# Patient Record
Sex: Female | Born: 1985 | Race: Black or African American | Hispanic: No | Marital: Single | Smoking: Former smoker
Health system: Southern US, Community
[De-identification: ages and names within clinical notes are randomized; demographics above are authoritative.]

## PROBLEM LIST (undated history)

## (undated) DIAGNOSIS — N83209 Unspecified ovarian cyst, unspecified side: Secondary | ICD-10-CM

## (undated) HISTORY — PX: FOOT SURGERY: SHX648

---

## 2012-12-16 ENCOUNTER — Encounter (HOSPITAL_BASED_OUTPATIENT_CLINIC_OR_DEPARTMENT_OTHER): Payer: Self-pay | Admitting: *Deleted

## 2012-12-16 ENCOUNTER — Emergency Department (HOSPITAL_BASED_OUTPATIENT_CLINIC_OR_DEPARTMENT_OTHER)
Admission: EM | Admit: 2012-12-16 | Discharge: 2012-12-16 | Disposition: A | Discharge status: Home / Self Care | Payer: Self-pay | Attending: Emergency Medicine | Admitting: Emergency Medicine

## 2012-12-16 DIAGNOSIS — Z3202 Encounter for pregnancy test, result negative: Secondary | ICD-10-CM | POA: Insufficient documentation

## 2012-12-16 DIAGNOSIS — M545 Low back pain, unspecified: Secondary | ICD-10-CM | POA: Insufficient documentation

## 2012-12-16 DIAGNOSIS — N390 Urinary tract infection, site not specified: Secondary | ICD-10-CM | POA: Insufficient documentation

## 2012-12-16 DIAGNOSIS — R300 Dysuria: Secondary | ICD-10-CM | POA: Insufficient documentation

## 2012-12-16 LAB — URINALYSIS, ROUTINE W REFLEX MICROSCOPIC
Bilirubin Urine: NEGATIVE
Nitrite: POSITIVE — AB
Specific Gravity, Urine: 1.021 (ref 1.005–1.030)
Urobilinogen, UA: 1 mg/dL (ref 0.0–1.0)

## 2012-12-16 LAB — URINE MICROSCOPIC-ADD ON

## 2012-12-16 MED ORDER — CYCLOBENZAPRINE HCL 10 MG PO TABS: 10.0000 mg | ORAL_TABLET | Freq: Three times a day (TID) | ORAL | Status: DC | PRN

## 2012-12-16 MED ORDER — SULFAMETHOXAZOLE-TRIMETHOPRIM 800-160 MG PO TABS: 1.0000 | ORAL_TABLET | Freq: Two times a day (BID) | ORAL | Status: DC

## 2012-12-16 MED ORDER — IBUPROFEN 800 MG PO TABS: 800.0000 mg | ORAL_TABLET | Freq: Three times a day (TID) | ORAL | Status: DC | PRN

## 2012-12-16 NOTE — ED Notes (Signed)
Lower back pain x 2 days. Sharp in nature. No known injury.

## 2012-12-16 NOTE — ED Provider Notes (Signed)
History     CSN: [id number]  Arrival date & time 12/16/12  1827   First MD Initiated Contact with Patient 12/16/12 1920      Chief Complaint  Patient presents with  . Back Pain    (Consider location/radiation/quality/duration/timing/severity/associated sxs/prior treatment) Patient is a 27 y.o. female presenting with back pain.  Back Pain  Pt reports moderate aching R lower back pain since yesterday worse with movement. She reports some mild dysuria, no fever or vomiting. No numbness or tingling in legs. No abdominal pain.   History reviewed. No pertinent past medical history.  History reviewed. No pertinent past surgical history.  No family history on file.  History  Substance Use Topics  . Smoking status: Never Smoker   . Smokeless tobacco: Not on file  . Alcohol Use: No    OB History   Grav Para Term Preterm Abortions TAB SAB Ect Mult Living                  Review of Systems  Musculoskeletal: Positive for back pain.   All other systems reviewed and are negative except as noted in HPI.   Allergies  Review of patient's allergies indicates no known allergies.  Home Medications  No current outpatient prescriptions on file.  BP 118/54  Pulse 60  Temp(Src) 98.6 F (37 C) (Oral)  Resp 16  Wt 130 lb (58.968 kg)  SpO2 100%  Physical Exam  Nursing note and vitals reviewed. Constitutional: She is oriented to person, place, and time. She appears well-developed and well-nourished.  HENT:  Head: Normocephalic and atraumatic.  Eyes: EOM are normal. Pupils are equal, round, and reactive to light.  Neck: Normal range of motion. Neck supple.  Cardiovascular: Normal rate, normal heart sounds and intact distal pulses.   Pulmonary/Chest: Effort normal and breath sounds normal.  Abdominal: Bowel sounds are normal. She exhibits no distension. There is no tenderness.  Musculoskeletal: Normal range of motion. She exhibits tenderness (Mild tenderness R lumbar paraspinal  muscles, no CVA tenderness). She exhibits no edema.  Neurological: She is alert and oriented to person, place, and time. She has normal strength. No cranial nerve deficit or sensory deficit.  Skin: Skin is warm and dry. No rash noted.  Psychiatric: She has a normal mood and affect.    ED Course  Procedures (including critical care time)  Labs Reviewed  URINALYSIS, ROUTINE W REFLEX MICROSCOPIC - Abnormal; Notable for the following:    APPearance CLOUDY (*)    Nitrite POSITIVE (*)    Leukocytes, UA SMALL (*)    All other components within normal limits  URINE MICROSCOPIC-ADD ON - Abnormal; Notable for the following:    Bacteria, UA MANY (*)    All other components within normal limits  URINE CULTURE  PREGNANCY, URINE   No results found.   1. Low back pain   2. UTI (urinary tract infection)       MDM  UA shows likely UTI given symptoms. Back pain is musculoskeletal, no concern for pyelonephritis. Bactrim for UTI, Motrin/Flexeril for back pain.         Charles B. Bernette Mayers, MD 12/16/12 1930

## 2012-12-16 NOTE — ED Notes (Signed)
Pt reports low back pain x 2 days without injury.  Denies numbness, tingling in extremities ot loss of bowel or bladder.  MAE WNL.

## 2012-12-19 LAB — URINE CULTURE: Colony Count: >=100000

## 2012-12-20 ENCOUNTER — Telehealth (HOSPITAL_COMMUNITY): Payer: Self-pay | Admitting: Emergency Medicine

## 2012-12-20 NOTE — ED Notes (Signed)
+  Urine. Patient treated with Bactrim. Sensitive to same. Per protocol MD.

## 2012-12-20 NOTE — ED Notes (Signed)
Patient has +Urine culture. °

## 2013-06-30 ENCOUNTER — Emergency Department (HOSPITAL_BASED_OUTPATIENT_CLINIC_OR_DEPARTMENT_OTHER)
Admission: EM | Admit: 2013-06-30 | Discharge: 2013-06-30 | Disposition: A | Discharge status: Home / Self Care | Payer: Medicaid Other | Attending: Emergency Medicine | Admitting: Emergency Medicine

## 2013-06-30 ENCOUNTER — Encounter (HOSPITAL_BASED_OUTPATIENT_CLINIC_OR_DEPARTMENT_OTHER): Payer: Self-pay | Admitting: Emergency Medicine

## 2013-06-30 ENCOUNTER — Emergency Department (HOSPITAL_BASED_OUTPATIENT_CLINIC_OR_DEPARTMENT_OTHER): Payer: Medicaid Other

## 2013-06-30 DIAGNOSIS — Z792 Long term (current) use of antibiotics: Secondary | ICD-10-CM | POA: Insufficient documentation

## 2013-06-30 DIAGNOSIS — O200 Threatened abortion: Secondary | ICD-10-CM

## 2013-06-30 DIAGNOSIS — Z79899 Other long term (current) drug therapy: Secondary | ICD-10-CM | POA: Insufficient documentation

## 2013-06-30 DIAGNOSIS — R110 Nausea: Secondary | ICD-10-CM | POA: Insufficient documentation

## 2013-06-30 DIAGNOSIS — R1031 Right lower quadrant pain: Secondary | ICD-10-CM | POA: Insufficient documentation

## 2013-06-30 DIAGNOSIS — R1033 Periumbilical pain: Secondary | ICD-10-CM | POA: Insufficient documentation

## 2013-06-30 LAB — CBC WITH DIFFERENTIAL/PLATELET
Basophils Relative: 0 % (ref 0–1)
Eosinophils Relative: 2 % (ref 0–5)
HCT: 34.4 % — ABNORMAL LOW (ref 36.0–46.0)
Hemoglobin: 11.4 g/dL — ABNORMAL LOW (ref 12.0–15.0)
Lymphocytes Relative: 33 % (ref 12–46)
Lymphs Abs: 1.6 10*3/uL (ref 0.7–4.0)
MCV: 70.3 fL — ABNORMAL LOW (ref 78.0–100.0)
Monocytes Relative: 11 % (ref 3–12)
Neutro Abs: 2.7 10*3/uL (ref 1.7–7.7)
RBC: 4.89 MIL/uL (ref 3.87–5.11)
WBC: 4.9 10*3/uL (ref 4.0–10.5)

## 2013-06-30 LAB — URINALYSIS, ROUTINE W REFLEX MICROSCOPIC
Bilirubin Urine: NEGATIVE
Hgb urine dipstick: NEGATIVE
Ketones, ur: 15 mg/dL — AB
Protein, ur: 30 mg/dL — AB
Urobilinogen, UA: 1 mg/dL (ref 0.0–1.0)

## 2013-06-30 LAB — PREGNANCY, URINE: Preg Test, Ur: POSITIVE — AB

## 2013-06-30 MED ORDER — METOCLOPRAMIDE HCL 10 MG PO TABS: 10.0000 mg | ORAL_TABLET | Freq: Four times a day (QID) | ORAL | Status: AC

## 2013-06-30 NOTE — ED Provider Notes (Signed)
CSN: [id number]     Arrival date & time 06/30/13  2050 History   First MD Initiated Contact with Patient 06/30/13 2107     This chart was scribed for Celene Kras, MD by Ladona Ridgel Day, ED scribe. This patient was seen in room MH10/MH10 and the patient's care was started at 2050.  Chief Complaint  Patient presents with  . Abdominal Pain   The history is provided by the patient. No language interpreter was used.   HPI Comments: Alexandra Sparks is a 27 y.o. female who presents to the Emergency Department complaining of intermittent, cramping periumbilical abdominal pain, 6/10 in pain, since yesterday. She reports associated nausea for x2 days. She states nothing makes her pain worse or better. She denies any vaginal bleeding or d/c. She denies dysuria, emesis, diarrhea, cough, fever/chills.  History reviewed. No pertinent past medical history. History reviewed. No pertinent past surgical history. No family history on file. History  Substance Use Topics  . Smoking status: Never Smoker   . Smokeless tobacco: Not on file  . Alcohol Use: No   OB History   Grav Para Term Preterm Abortions TAB SAB Ect Mult Living                 Review of Systems  Constitutional: Negative for fever and chills.  Respiratory: Negative for shortness of breath.   Gastrointestinal: Positive for nausea and abdominal pain. Negative for vomiting.  Musculoskeletal: Negative for back pain.  Neurological: Negative for weakness.  All other systems reviewed and are negative.   A complete 10 system review of systems was obtained and all systems are negative except as noted in the HPI and PMH.   Allergies  Review of patient's allergies indicates no known allergies.  Home Medications   Current Outpatient Rx  Name  Route  Sig  Dispense  Refill  . cyclobenzaprine (FLEXERIL) 10 MG tablet   Oral   Take 1 tablet (10 mg total) by mouth 3 (three) times daily as needed for muscle spasms.   30 tablet   0   . ibuprofen  (ADVIL,MOTRIN) 800 MG tablet   Oral   Take 1 tablet (800 mg total) by mouth every 8 (eight) hours as needed for pain.   30 tablet   0   . metoCLOPramide (REGLAN) 10 MG tablet   Oral   Take 1 tablet (10 mg total) by mouth every 6 (six) hours.   30 tablet   0   . sulfamethoxazole-trimethoprim (BACTRIM DS,SEPTRA DS) 800-160 MG per tablet   Oral   Take 1 tablet by mouth 2 (two) times daily.   6 tablet   0    Triage Vitals: BP 122/60  Pulse 81  Temp(Src) 98.8 F (37.1 C) (Oral)  Resp 18  Wt 130 lb (58.968 kg)  SpO2 100%  LMP 05/23/2013 Physical Exam  Nursing note and vitals reviewed. Constitutional: She appears well-developed and well-nourished. No distress.  HENT:  Head: Normocephalic and atraumatic.  Right Ear: External ear normal.  Left Ear: External ear normal.  Eyes: Conjunctivae are normal. Right eye exhibits no discharge. Left eye exhibits no discharge. No scleral icterus.  Neck: Neck supple. No tracheal deviation present.  Cardiovascular: Normal rate, regular rhythm and intact distal pulses.   Pulmonary/Chest: Effort normal and breath sounds normal. No stridor. No respiratory distress. She has no wheezes. She has no rales.  Abdominal: Soft. Bowel sounds are normal. She exhibits no distension. There is tenderness (tender to palpation RLQ). There  is no rebound and no guarding.  Musculoskeletal: She exhibits no edema and no tenderness.  Neurological: She is alert. She has normal strength. No sensory deficit. Cranial nerve deficit:  no gross defecits noted. She exhibits normal muscle tone. She displays no seizure activity. Coordination normal.  Skin: Skin is warm and dry. No rash noted.  Psychiatric: She has a normal mood and affect.    ED Course  Procedures (including critical care time) DIAGNOSTIC STUDIES: Oxygen Saturation is 100% on room air, normal by my interpretation.    COORDINATION OF CARE: At 915 PM Discussed treatment plan with patient which includes  abdominal ultrasound, blood work, pelvic exam. Patient agrees.   Labs Review Imaging Review US Ob Comp Less 14 Wks  06/30/2013   CLINICAL DATA:  Abdominal pain. Pregnancy.  EXAM: OBSTETRIC <14 WK [place] AND TRANSVAGINAL OB US  TECHNIQUE: Both transabdominal and transvaginal ultrasound examinations were performed for complete evaluation of the gestation as well as the maternal uterus, adnexal regions, and pelvic cul-de-sac. Transvaginal technique was performed to assess early pregnancy.  COMPARISON:  None.  FINDINGS: Intrauterine gestational sac: Visualized/normal in shape.  Yolk sac:  Negative  Embryo:  Negative  Cardiac Activity: Negative  Heart Rate:  Negative bpm  MSD:  4.5 mm  mm   5 w   2  d  Maternal uterus/adnexae: Negative  IMPRESSION: Intrauterine gestational sac. Mean sac diameter 4.5 mm corresponding to 5 week 2 day. Yolk sac and fetus not identified. Close followup suggested.   Electronically Signed   By: Marlan Palau M.D.   On: 06/30/2013 22:36   US Ob Transvaginal  06/30/2013   CLINICAL DATA:  Abdominal pain. Pregnancy.  EXAM: OBSTETRIC <14 WK [place] AND TRANSVAGINAL OB US  TECHNIQUE: Both transabdominal and transvaginal ultrasound examinations were performed for complete evaluation of the gestation as well as the maternal uterus, adnexal regions, and pelvic cul-de-sac. Transvaginal technique was performed to assess early pregnancy.  COMPARISON:  None.  FINDINGS: Intrauterine gestational sac: Visualized/normal in shape.  Yolk sac:  Negative  Embryo:  Negative  Cardiac Activity: Negative  Heart Rate:  Negative bpm  MSD:  4.5 mm  mm   5 w   2  d  Maternal uterus/adnexae: Negative  IMPRESSION: Intrauterine gestational sac. Mean sac diameter 4.5 mm corresponding to 5 week 2 day. Yolk sac and fetus not identified. Close followup suggested.   Electronically Signed   By: Marlan Palau M.D.   On: 06/30/2013 22:36     MDM   1. Threatened miscarriage in early pregnancy    the patient has a threatened  miscarriage.  The ultrasound shows a gestational sac but no definitive fetal pole. This however does correlate with her Quant and history. She will require close follow up with an OB/GYN doctor for serial hCGs and reevaluation I personally performed the services described in this documentation, which was scribed in my presence.  The recorded information has been reviewed and is accurate.     Celene Kras, MD 06/30/13 2258

## 2013-06-30 NOTE — ED Notes (Signed)
Pt notified by EDP Lynelle Doctor of +preg test

## 2013-06-30 NOTE — ED Notes (Signed)
MD at bedside. 

## 2013-06-30 NOTE — ED Notes (Signed)
Abdominal pain since last night. Nausea x 2 days.

## 2013-07-06 ENCOUNTER — Other Ambulatory Visit: Payer: Medicaid Other

## 2013-07-06 ENCOUNTER — Ambulatory Visit (HOSPITAL_COMMUNITY)
Admission: RE | Admit: 2013-07-06 | Discharge: 2013-07-06 | Disposition: A | Discharge status: Home / Self Care | Payer: Medicaid Other | Source: Ambulatory Visit | Attending: Family | Admitting: Family

## 2013-07-06 ENCOUNTER — Encounter: Payer: Self-pay | Admitting: Family

## 2013-07-06 ENCOUNTER — Other Ambulatory Visit: Payer: Self-pay | Admitting: Family

## 2013-07-06 DIAGNOSIS — Z3689 Encounter for other specified antenatal screening: Secondary | ICD-10-CM | POA: Insufficient documentation

## 2013-07-06 DIAGNOSIS — O26899 Other specified pregnancy related conditions, unspecified trimester: Secondary | ICD-10-CM

## 2013-07-06 DIAGNOSIS — O200 Threatened abortion: Secondary | ICD-10-CM

## 2013-07-06 DIAGNOSIS — R109 Unspecified abdominal pain: Secondary | ICD-10-CM | POA: Insufficient documentation

## 2013-07-06 DIAGNOSIS — O99891 Other specified diseases and conditions complicating pregnancy: Secondary | ICD-10-CM | POA: Insufficient documentation

## 2013-07-07 ENCOUNTER — Emergency Department (HOSPITAL_BASED_OUTPATIENT_CLINIC_OR_DEPARTMENT_OTHER)
Admission: EM | Admit: 2013-07-07 | Discharge: 2013-07-07 | Disposition: A | Discharge status: Home / Self Care | Payer: Medicaid Other | Attending: Emergency Medicine | Admitting: Emergency Medicine

## 2013-07-07 ENCOUNTER — Encounter (HOSPITAL_BASED_OUTPATIENT_CLINIC_OR_DEPARTMENT_OTHER): Payer: Self-pay | Admitting: Emergency Medicine

## 2013-07-07 DIAGNOSIS — O200 Threatened abortion: Secondary | ICD-10-CM | POA: Insufficient documentation

## 2013-07-07 DIAGNOSIS — Z792 Long term (current) use of antibiotics: Secondary | ICD-10-CM | POA: Insufficient documentation

## 2013-07-07 LAB — HCG, QUANTITATIVE, PREGNANCY: hCG, Beta Chain, Quant, S: 2893.3 m[IU]/mL

## 2013-07-07 NOTE — ED Provider Notes (Signed)
CSN: [id number]     Arrival date & time 07/07/13  0544 History   First MD Initiated Contact with Patient 07/07/13 0557     Chief Complaint  Patient presents with  . Vaginal Bleeding   (Consider location/radiation/quality/duration/timing/severity/associated sxs/prior Treatment) HPI Comments: Patient is a 27 year old female G1 at approximately 5-1/[redacted] weeks gestation. She presents to the emergency department with complaints of going to the bathroom and noticing a slight amount of pink tinged discharge. He is concerned that this is blood. She denies any cramping or passage of clots. She was recently seen and evaluated for similar complaints and had workup into this, including an ultrasound laboratory studies and pelvic examination.  Patient is a 27 y.o. female presenting with vaginal bleeding. The history is provided by the patient.  Vaginal Bleeding Quality: Pink tinged. Severity:  Mild Onset quality:  Sudden Duration:  1 hour Timing: Once. Progression:  Resolved Chronicity:  New   History reviewed. No pertinent past medical history. History reviewed. No pertinent past surgical history. No family history on file. History  Substance Use Topics  . Smoking status: Never Smoker   . Smokeless tobacco: Not on file  . Alcohol Use: No   OB History   Grav Para Term Preterm Abortions TAB SAB Ect Mult Living                 Review of Systems  Genitourinary: Positive for vaginal bleeding.  All other systems reviewed and are negative.    Allergies  Review of patient's allergies indicates no known allergies.  Home Medications   Current Outpatient Rx  Name  Route  Sig  Dispense  Refill  . cyclobenzaprine (FLEXERIL) 10 MG tablet   Oral   Take 1 tablet (10 mg total) by mouth 3 (three) times daily as needed for muscle spasms.   30 tablet   0   . ibuprofen (ADVIL,MOTRIN) 800 MG tablet   Oral   Take 1 tablet (800 mg total) by mouth every 8 (eight) hours as needed for pain.   30  tablet   0   . metoCLOPramide (REGLAN) 10 MG tablet   Oral   Take 1 tablet (10 mg total) by mouth every 6 (six) hours.   30 tablet   0   . sulfamethoxazole-trimethoprim (BACTRIM DS,SEPTRA DS) 800-160 MG per tablet   Oral   Take 1 tablet by mouth 2 (two) times daily.   6 tablet   0    BP 133/56  Pulse 80  Temp(Src) 98.8 F (37.1 C) (Oral)  Resp 18  Ht 5\' 2"  (1.575 m)  Wt 135 lb (61.236 kg)  BMI 24.69 kg/m2  SpO2 100%  LMP 05/23/2013 Physical Exam  Nursing note and vitals reviewed. Constitutional: She is oriented to person, place, and time. She appears well-developed and well-nourished. No distress.  HENT:  Head: Normocephalic and atraumatic.  Neck: Normal range of motion. Neck supple.  Cardiovascular: Normal rate and regular rhythm.  Exam reveals no gallop and no friction rub.   No murmur heard. Pulmonary/Chest: Effort normal and breath sounds normal. No respiratory distress. She has no wheezes.  Abdominal: Soft. Bowel sounds are normal. She exhibits no distension. There is no tenderness.  Musculoskeletal: Normal range of motion.  Neurological: She is alert and oriented to person, place, and time.  Skin: Skin is warm and dry. She is not diaphoretic.    ED Course  Procedures (including critical care time) Labs Review Labs Reviewed - No data to display Imaging  Review US Ob Transvaginal  07/06/2013   CLINICAL DATA:  Pregnant, evaluate for viability  EXAM: TRANSVAGINAL OB ULTRASOUND  TECHNIQUE: Transvaginal ultrasound was performed for complete evaluation of the gestation as well as the maternal uterus, adnexal regions, and pelvic cul-de-sac.  COMPARISON:  06/30/2013  FINDINGS: Intrauterine gestational sac: Visualized/normal in shape.  Yolk sac:  Present  Embryo:  Not visualized  MSD: 8.8  mm   5 w   4  d  [place] EDC: 03/04/2014  Maternal uterus/adnexae: Small subchorionic hemorrhage.  Right ovary is within normal limits, measuring 1.4 x 2.8 x 1.4 cm.  Left ovary is within  normal limits, measuring 2.1 x 1.6 x 1.7 cm.  No free fluid.  IMPRESSION: Single intrauterine gestational sac with yolk sac, measuring 5 weeks 4 days by mean sac diameter. No fetal pole is seen.  Correlate with serial beta HCG and consider follow-up pelvic ultrasound in 10 days to confirm viability as clinically warranted.   Electronically Signed   By: Charline Bills M.D.   On: 07/06/2013 12:21    EKG Interpretation   None       MDM  No diagnosis found. Patient is not having any active bleeding and her physical examination is unremarkable. Upon reviewing her chart, she had a transvaginal ultrasound performed yesterday which revealed a 5-1/2 week intrauterine pregnancy. Her beta hCG has also doubled over the past several days. She had pelvic examination performed several days ago which did not reveal any acute pathology. I explained to the patient that I am unable to know for certain as to whether this pink tinged discharge is result of the transvaginal ultrasound had been performed yesterday or the beginning of a miscarriage. I explained to her that time will tell as to which direction things will work out.  She has already had an ultrasound yesterday, blood tests confirming a doubling of her beta hCG, and also a pelvic examination all within the past week. I do not feel as though a repeat examination or further workup is indicated at this time. She is not having any further episodes of bleeding and whatever she had before has resolved. She will be discharged home with instructions to perform light duties at work and vaginal rest at home. To return as needed if she develops abdominal pain or the passage of tissue or clots.    Geoffery Lyons, MD 07/07/13 [phone]

## 2013-07-07 NOTE — ED Notes (Signed)
Pt states she is [redacted] weeks pregnant - states she was at work when she went to the bathroom and noticed a small amount of pink tinged vaginal bleeding.

## 2013-07-07 NOTE — ED Notes (Signed)
MD at bedside. 

## 2013-07-13 ENCOUNTER — Emergency Department (HOSPITAL_COMMUNITY): Payer: Medicaid Other

## 2013-07-13 ENCOUNTER — Encounter (HOSPITAL_BASED_OUTPATIENT_CLINIC_OR_DEPARTMENT_OTHER): Payer: Self-pay | Admitting: Emergency Medicine

## 2013-07-13 ENCOUNTER — Emergency Department (HOSPITAL_BASED_OUTPATIENT_CLINIC_OR_DEPARTMENT_OTHER)
Admission: EM | Admit: 2013-07-13 | Discharge: 2013-07-13 | Disposition: A | Discharge status: Home / Self Care | Payer: Medicaid Other | Attending: Emergency Medicine | Admitting: Emergency Medicine

## 2013-07-13 DIAGNOSIS — M539 Dorsopathy, unspecified: Secondary | ICD-10-CM | POA: Insufficient documentation

## 2013-07-13 DIAGNOSIS — N390 Urinary tract infection, site not specified: Secondary | ICD-10-CM | POA: Insufficient documentation

## 2013-07-13 DIAGNOSIS — O209 Hemorrhage in early pregnancy, unspecified: Secondary | ICD-10-CM | POA: Insufficient documentation

## 2013-07-13 DIAGNOSIS — O200 Threatened abortion: Secondary | ICD-10-CM

## 2013-07-13 DIAGNOSIS — R109 Unspecified abdominal pain: Secondary | ICD-10-CM | POA: Insufficient documentation

## 2013-07-13 LAB — CBC WITH DIFFERENTIAL/PLATELET
Basophils Relative: 0 % (ref 0–1)
Eosinophils Relative: 2 % (ref 0–5)
HCT: 33.6 % — ABNORMAL LOW (ref 36.0–46.0)
Hemoglobin: 11.2 g/dL — ABNORMAL LOW (ref 12.0–15.0)
MCH: 23.4 pg — ABNORMAL LOW (ref 26.0–34.0)
MCHC: 33.3 g/dL (ref 30.0–36.0)
MCV: 70.1 fL — ABNORMAL LOW (ref 78.0–100.0)
Monocytes Absolute: 0.5 10*3/uL (ref 0.1–1.0)
Monocytes Relative: 9 % (ref 3–12)
Neutro Abs: 3.1 10*3/uL (ref 1.7–7.7)

## 2013-07-13 LAB — URINALYSIS, ROUTINE W REFLEX MICROSCOPIC
Bilirubin Urine: NEGATIVE
Nitrite: NEGATIVE
Specific Gravity, Urine: 1.029 (ref 1.005–1.030)
pH: 6.5 (ref 5.0–8.0)

## 2013-07-13 LAB — URINE MICROSCOPIC-ADD ON

## 2013-07-13 LAB — HCG, QUANTITATIVE, PREGNANCY: hCG, Beta Chain, Quant, S: 3881 m[IU]/mL — ABNORMAL HIGH (ref <5)

## 2013-07-13 MED ORDER — CEPHALEXIN 500 MG PO CAPS: 500.0000 mg | ORAL_CAPSULE | Freq: Two times a day (BID) | ORAL | Status: AC

## 2013-07-13 NOTE — ED Notes (Signed)
Preparing pt for d/c and self transfer by POV to [facility] for [place], see EMTALA form/sheet, report to be called.

## 2013-07-13 NOTE — ED Notes (Addendum)
G1P0: ~[redacted] weeks pregnant. LMP 9/15. EDD 6/23. Reports vaginal bleeding onset last Thursday, on and off. Heavier tonight with small clots in the commode around 0200. Mentions some abd pain and some L side pain. (Denies: fever, dizziness, sob or nausea). (Denies: vaginal d/c or urinary sx). Seen at [facility] clinic 2 wednesdays ago. Alert, NAD, calm, interactive, mildly anxious/upset. Takes only prenatal vitamins.

## 2013-07-13 NOTE — ED Provider Notes (Addendum)
Pt sent from [facility] by Dr Wilkie Aye for pelvic ultrasound.  Pt G1P0, lnmp 9/15, w 3rd ED visit w vaginal spotting, threatened miscarriage.  Had full H and P at [facility], including female exam, but given unavailability of u/s was sent here by Dr Wilkie Aye to get U/s. Blood type b+. hgb stable at 11.2. Quant bhcg 3881, increased from prior, but not as much as would expect if normal/viable IUP. U/s 10/29 c/w [redacted]w[redacted]d iUP.  Pt denies abd pain. abd soft nt. Vitals normal. U/s pending.   Trending hcg results below:      hCG, quantitative, pregnancy (Final result) Abnormal Component (Lab Inquiry)     Collection Time Result Time hCG, Beta Chain, Sharene Butters, S    07/13/13 03:25:00 07/13/13 03:59:13 3881 (H) GEST. AGE CONC. (mIU/mL) <=1 WEEK 5 - 50 2 WEEKS 50 - 500 3 WEEKS 100 - 10,000 4 WEEKS 1,000 - 30,000 5 WEEKS 3,500 - 115,000 6-8 WEEKS 12,000 - 270,000 12 WEEKS 15,000 - 220,000 FEMALE AND NON-PREGNANT FEMALE: LESS THAN 5 mIU/mL    Previous Results     07/06/13 11:19:00 07/07/13 02:14:00 2893.3    06/30/13 21:40:00 06/30/13 22:07:59 1543 (H)           [place] results:  IMPRESSION: 1. Although there is a single viable IUP, the gestational sac is extremely ovoid in shape, and the fetal heart rate is only 73 beats per min. Per report from the sonographer, quantitative beta HCG was 1,543 and 2,893 on 10/23 and 10/29 respectively. Currently it is only 3,881. Given the findings on today's examination, and failure of normal rise in beta HCG, findings are concerning for potential failing pregnancy.   Pt w 3 ED visits for same, and as of yet hasnt been able to establish gyn outpt follow up - as such, will page and discuss w ob/gyn on call to facilitate outpt f/u.   Discussed w Dr Debroah Loop, including trend in bhcgs, todays u/s,  - [facility], and they will further assess then.       Suzi Roots, MD 07/13/13 [phone]

## 2013-07-13 NOTE — ED Notes (Signed)
Patient transfer from [facility] to [facility] for ultrasound.  Dr Denton Lank aware of patient.

## 2013-07-13 NOTE — ED Notes (Signed)
Dr. Wilkie Aye into room to see pt.

## 2013-07-13 NOTE — ED Provider Notes (Signed)
CSN: [id number]     Arrival date & time 07/13/13  0301 History   None    Chief Complaint  Patient presents with  . Vaginal Bleeding  . Abdominal Pain  . Back Pain   (Consider location/radiation/quality/duration/timing/severity/associated sxs/prior Treatment) HPI  This is a 26 year old G1 P0 female approximately [redacted] weeks pregnant who presents with abdominal cramping and vaginal bleeding. Patient reports she's been spotting since last Thursday. She has not seen her OB yet. Patient states she had increasing vaginal bleeding today with clots noted in the toilet. She has not had to wear a pad but does have to wear a panty liner. She reports crampy abdominal pain over the suprapubic region that is nonradiating. She denies any lateralizing pain. Patient has been seen multiple times for bleeding during this pregnancy. Last ultrasound was on October 29 which showed an intrauterine gestational sac with yolk sac. No fetal pole noted.  Patient was to have repeat ultrasound and beta within 10 days. Review of patient's chart reveals that she is B+.  History reviewed. No pertinent past medical history. Past Surgical History  Procedure Laterality Date  . Foot surgery Bilateral    No family history on file. History  Substance Use Topics  . Smoking status: Never Smoker   . Smokeless tobacco: Not on file  . Alcohol Use: No   OB History   Grav Para Term Preterm Abortions TAB SAB Ect Mult Living   1 0             Review of Systems  Gastrointestinal: Positive for abdominal pain. Negative for nausea and vomiting.  Genitourinary: Positive for vaginal bleeding. Negative for dysuria.  Neurological: Negative for dizziness and syncope.  All other systems reviewed and are negative.    Allergies  Review of patient's allergies indicates no known allergies.  Home Medications   Current Outpatient Rx  Name  Route  Sig  Dispense  Refill  . cyclobenzaprine (FLEXERIL) 10 MG tablet   Oral   Take 1 tablet  (10 mg total) by mouth 3 (three) times daily as needed for muscle spasms.   30 tablet   0   . ibuprofen (ADVIL,MOTRIN) 800 MG tablet   Oral   Take 1 tablet (800 mg total) by mouth every 8 (eight) hours as needed for pain.   30 tablet   0   . metoCLOPramide (REGLAN) 10 MG tablet   Oral   Take 1 tablet (10 mg total) by mouth every 6 (six) hours.   30 tablet   0   . sulfamethoxazole-trimethoprim (BACTRIM DS,SEPTRA DS) 800-160 MG per tablet   Oral   Take 1 tablet by mouth 2 (two) times daily.   6 tablet   0   . Prenatal Vit-Fe Fumarate-FA (PRENATAL MULTIVITAMIN) TABS tablet   Oral   Take 1 tablet by mouth daily at 12 noon. "A prenatal vitamin", not sure what/which          BP 98/54  Pulse 86  Temp(Src) 98.9 F (37.2 C) (Oral)  Resp 20  SpO2 100%  LMP 05/23/2013 Physical Exam  Nursing note and vitals reviewed. Constitutional: She is oriented to person, place, and time. She appears well-developed and well-nourished.  HENT:  Head: Normocephalic and atraumatic.  Cardiovascular: Normal rate, regular rhythm and normal heart sounds.   No murmur heard. Pulmonary/Chest: Effort normal and breath sounds normal. No respiratory distress.  Abdominal: Soft. Bowel sounds are normal. There is no tenderness. There is no rebound and no guarding.  No lateralizing abdominal tenderness  Genitourinary:  There is a small amount of blood in the vaginal vault, no active bleeding from the os, cervical os is closed  Neurological: She is alert and oriented to person, place, and time.  Skin: Skin is warm and dry.  Psychiatric: She has a normal mood and affect.    ED Course  Procedures (including critical care time) Labs Review Labs Reviewed  CBC WITH DIFFERENTIAL - Abnormal; Notable for the following:    Hemoglobin 11.2 (*)    HCT 33.6 (*)    MCV 70.1 (*)    MCH 23.4 (*)    All other components within normal limits  URINALYSIS, ROUTINE W REFLEX MICROSCOPIC - Abnormal; Notable for the  following:    APPearance CLOUDY (*)    Hgb urine dipstick LARGE (*)    Leukocytes, UA TRACE (*)    All other components within normal limits  URINE MICROSCOPIC-ADD ON - Abnormal; Notable for the following:    Squamous Epithelial / LPF FEW (*)    Bacteria, UA MANY (*)    All other components within normal limits  HCG, QUANTITATIVE, PREGNANCY   Imaging Review No results found.  EKG Interpretation   None       MDM   1. Bleeding in early pregnancy   2. Urinary tract infection     This is a 27 year old G. one P0 female approximately [redacted] weeks pregnant who presents with vaginal bleeding. She is nontoxic-appearing on exam and vital signs are notable for a blood pressure of 103/54.  Patient's prior documented systolic blood pressures have been in the 120s. Orthostatic vital signs were obtained and are within normal limits and hemoglobin is stable. Urinalysis and quantitative beta and are pending. Pelvic exam is notable for a small amount of blood in the vaginal vault and a closed os. Patient needs evaluation with ultrasound. Given her recent ultrasound showing intrauterine gestational sac with yolk sac, low suspicion for ectopic pregnancy; however heterotopic pregnancy cannot be fully excluded.  This likely is a threatened miscarriage.  We do not have ultrasound at this time and the patient will need to go to [facility] for this evaluation. I discussed this with the patient. I feel she is safe for transfer by private vehicle.  Prior to transfer, urinalysis was reviewed. Patient has bacteria without significant white cells. Given pregnancy, we'll treat with 3 days of Keflex.  Shon Baton, MD 07/13/13 [phone]

## 2013-07-13 NOTE — ED Notes (Signed)
Pt states understanding of discharge instructions 

## 2013-07-13 NOTE — ED Notes (Signed)
Dr. Wilkie Aye and chaperone/ Sue Lush, RN at [facility] for pelvic exam

## 2013-07-14 ENCOUNTER — Encounter (HOSPITAL_BASED_OUTPATIENT_CLINIC_OR_DEPARTMENT_OTHER): Payer: Self-pay | Admitting: Emergency Medicine

## 2013-07-14 ENCOUNTER — Other Ambulatory Visit: Payer: Self-pay

## 2013-07-14 ENCOUNTER — Emergency Department (HOSPITAL_BASED_OUTPATIENT_CLINIC_OR_DEPARTMENT_OTHER)
Admission: EM | Admit: 2013-07-14 | Discharge: 2013-07-14 | Disposition: A | Discharge status: Home / Self Care | Payer: Medicaid Other | Attending: Emergency Medicine | Admitting: Emergency Medicine

## 2013-07-14 ENCOUNTER — Emergency Department (HOSPITAL_BASED_OUTPATIENT_CLINIC_OR_DEPARTMENT_OTHER): Payer: Medicaid Other

## 2013-07-14 DIAGNOSIS — O039 Complete or unspecified spontaneous abortion without complication: Secondary | ICD-10-CM

## 2013-07-14 DIAGNOSIS — R109 Unspecified abdominal pain: Secondary | ICD-10-CM | POA: Insufficient documentation

## 2013-07-14 DIAGNOSIS — Z79899 Other long term (current) drug therapy: Secondary | ICD-10-CM | POA: Insufficient documentation

## 2013-07-14 LAB — HCG, QUANTITATIVE, PREGNANCY: hCG, Beta Chain, Quant, S: 973 m[IU]/mL — ABNORMAL HIGH (ref <5)

## 2013-07-14 NOTE — ED Provider Notes (Signed)
CSN: [id number]     Arrival date & time 07/14/13  1514 History   First MD Initiated Contact with Patient 07/14/13 1539     Chief Complaint  Patient presents with  . Vaginal Bleeding   (Consider location/radiation/quality/duration/timing/severity/associated sxs/prior Treatment) HPI Comments: 27 year old female results with vaginal bleeding, larger clots, and possible tissue passage about 10 hours ago. She's been seen multiple times in the ED for threatened miscarriages. About 24 hours ago she was seen in ultrasound concerning for an upcoming miscarriage. [facility] After she passed the clots and what she thinks was tissue he called the gynecologist who stated she should come to the ER for further evaluation. She denies any lightheadedness or weakness. She feels intermittently nauseous but not right now. She says the cramping in her lower abdomen is better.   History reviewed. No pertinent past medical history. Past Surgical History  Procedure Laterality Date  . Foot surgery Bilateral    History reviewed. No pertinent family history. History  Substance Use Topics  . Smoking status: Never Smoker   . Smokeless tobacco: Not on file  . Alcohol Use: No   OB History   Grav Para Term Preterm Abortions TAB SAB Ect Mult Living   1 0             Review of Systems  Constitutional: Negative for fatigue.  Gastrointestinal: Positive for abdominal pain (cramping). Negative for vomiting.  Genitourinary: Positive for vaginal bleeding. Negative for dysuria and vaginal pain.  Neurological: Negative for weakness and light-headedness.  All other systems reviewed and are negative.    Allergies  Review of patient's allergies indicates no known allergies.  Home Medications   Current Outpatient Rx  Name  Route  Sig  Dispense  Refill  . cephALEXin (KEFLEX) 500 MG capsule   Oral   Take 1 capsule (500 mg total) by mouth 2 (two) times daily.   6 capsule   0   .  metoCLOPramide (REGLAN) 10 MG tablet   Oral   Take 1 tablet (10 mg total) by mouth every 6 (six) hours.   30 tablet   0   . Prenatal Vit-Fe Fumarate-FA (PRENATAL MULTIVITAMIN) TABS tablet   Oral   Take 1 tablet by mouth daily at 12 noon. "A prenatal vitamin", not sure what/which          BP 113/56  Pulse 74  Temp(Src) 98.5 F (36.9 C) (Oral)  Resp 18  SpO2 100%  LMP 05/23/2013 Physical Exam  Nursing note and vitals reviewed. Constitutional: She is oriented to person, place, and time. She appears well-developed and well-nourished. No distress.  HENT:  Head: Normocephalic and atraumatic.  Right Ear: External ear normal.  Left Ear: External ear normal.  Nose: Nose normal.  Eyes: Right eye exhibits no discharge. Left eye exhibits no discharge.  Cardiovascular: Normal rate, regular rhythm and normal heart sounds.   No murmur heard. Pulmonary/Chest: Effort normal and breath sounds normal.  Abdominal: Soft. She exhibits no distension. There is no tenderness.  Neurological: She is alert and oriented to person, place, and time.  Skin: Skin is warm and dry.    ED Course  Procedures (including critical care time) Labs Review Labs Reviewed  HCG, QUANTITATIVE, PREGNANCY - Abnormal; Notable for the following:    hCG, Beta Chain, Quant, S 973 (*)    All other components within normal limits   Imaging Review US Ob Comp Less 14 Wks  07/14/2013   CLINICAL  DATA:  Vaginal bleeding, intrauterine gestational sac with fetal pole and cardiac activity seen on yesterday's prior exam  EXAM: OBSTETRIC <14 WK [place] AND TRANSVAGINAL OB US  TECHNIQUE: Both transabdominal and transvaginal ultrasound examinations were performed for complete evaluation of the gestation as well as the maternal uterus, adnexal regions, and pelvic cul-de-sac. Transvaginal technique was performed to assess early pregnancy.  COMPARISON:  07/13/2013 at [facility]  FINDINGS: Intrauterine gestational sac: No  longer visualized  Yolk sac:  Not visualized  Embryo:  Not visualized  Cardiac Activity: Not visualized  Maternal uterus/adnexae: The previously seen intrauterine gestational sac is no longer identified. Hypoechoic material within the endometrium at the fundus most likely represents debris or blood products. A 4 mm ovoid hypoechoic structure within the fundal endometrial canal is most compatible with debris or fluid, given the described in size between this finding and the previously seen 12 mm gestational sac seen on prior exam yesterday. The ovaries are normal.  IMPRESSION: Apparent interval passage of intrauterine gestational sac compatible with spontaneous/missed abortion with fluid and or debris within the endometrial canal.   Electronically Signed   By: Christiana Pellant M.D.   On: 07/14/2013 16:50   US Ob Comp Less 14 Wks  07/13/2013   CLINICAL DATA:  Vaginal bleeding. Early pregnancy. HCG currently 3,881.  EXAM: OBSTETRIC <14 WK [place] AND TRANSVAGINAL OB US  TECHNIQUE: Both transabdominal and transvaginal ultrasound examinations were performed for complete evaluation of the gestation as well as the maternal uterus, adnexal regions, and pelvic cul-de-sac. Transvaginal technique was performed to assess early pregnancy.  COMPARISON:  OB ultrasound 07/10/2013.  FINDINGS: Intrauterine gestational sac: Very ovoid shaped sac in the fundal portion of the endometrial canal.  Yolk sac:  Present.  Embryo:  Present.  Cardiac Activity: Present.  Heart Rate:  73 bpm  MSD:  11.6  mm   6 w   0  d  CRL:   3.0  mm   5 w 6 d  Maternal uterus/adnexae: No evidence of subchorionic hemorrhage. Normal sonographic appearance of the ovaries bilaterally. No significant free fluid in the cul-de-sac.  IMPRESSION: 1. Although there is a single viable IUP, the gestational sac is extremely ovoid in shape, and the fetal heart rate is only 73 beats per min. Per report from the sonographer, quantitative beta HCG was 1,543 and 2,893 on 10/23 and  10/29 respectively. Currently it is only 3,881. Given the findings on today's examination, and failure of normal rise in beta HCG, findings are concerning for potential failing pregnancy.   Electronically Signed   By: Trudie Reed M.D.   On: 07/13/2013 06:18   US Ob Transvaginal  07/14/2013   CLINICAL DATA:  Vaginal bleeding, intrauterine gestational sac with fetal pole and cardiac activity seen on yesterday's prior exam  EXAM: OBSTETRIC <14 WK [place] AND TRANSVAGINAL OB US  TECHNIQUE: Both transabdominal and transvaginal ultrasound examinations were performed for complete evaluation of the gestation as well as the maternal uterus, adnexal regions, and pelvic cul-de-sac. Transvaginal technique was performed to assess early pregnancy.  COMPARISON:  07/13/2013 at [facility]  FINDINGS: Intrauterine gestational sac: No longer visualized  Yolk sac:  Not visualized  Embryo:  Not visualized  Cardiac Activity: Not visualized  Maternal uterus/adnexae: The previously seen intrauterine gestational sac is no longer identified. Hypoechoic material within the endometrium at the fundus most likely represents debris or blood products. A 4 mm ovoid hypoechoic structure within the fundal endometrial canal is  most compatible with debris or fluid, given the described in size between this finding and the previously seen 12 mm gestational sac seen on prior exam yesterday. The ovaries are normal.  IMPRESSION: Apparent interval passage of intrauterine gestational sac compatible with spontaneous/missed abortion with fluid and or debris within the endometrial canal.   Electronically Signed   By: Christiana Pellant M.D.   On: 07/14/2013 16:50   US Ob Transvaginal  07/13/2013   CLINICAL DATA:  Vaginal bleeding. Early pregnancy. HCG currently 3,881.  EXAM: OBSTETRIC <14 WK [place] AND TRANSVAGINAL OB US  TECHNIQUE: Both transabdominal and transvaginal ultrasound examinations were performed for complete evaluation of the  gestation as well as the maternal uterus, adnexal regions, and pelvic cul-de-sac. Transvaginal technique was performed to assess early pregnancy.  COMPARISON:  OB ultrasound 07/10/2013.  FINDINGS: Intrauterine gestational sac: Very ovoid shaped sac in the fundal portion of the endometrial canal.  Yolk sac:  Present.  Embryo:  Present.  Cardiac Activity: Present.  Heart Rate:  73 bpm  MSD:  11.6  mm   6 w   0  d  CRL:   3.0  mm   5 w 6 d  Maternal uterus/adnexae: No evidence of subchorionic hemorrhage. Normal sonographic appearance of the ovaries bilaterally. No significant free fluid in the cul-de-sac.  IMPRESSION: 1. Although there is a single viable IUP, the gestational sac is extremely ovoid in shape, and the fetal heart rate is only 73 beats per min. Per report from the sonographer, quantitative beta HCG was 1,543 and 2,893 on 10/23 and 10/29 respectively. Currently it is only 3,881. Given the findings on today's examination, and failure of normal rise in beta HCG, findings are concerning for potential failing pregnancy.   Electronically Signed   By: Trudie Reed M.D.   On: 07/13/2013 06:18    EKG Interpretation   None       MDM   1. Spontaneous miscarriage    Declining hCG and ultrasound are consistent with a miscarriage. Patient is not feeling any anemia symptoms. Her abdominal exam is benign. Due to this patient states she prefers not to have a repeat pelvic. I do not see an emergent reason at this point in time. Discussed return precautions and that she should still followup with GYN.    Audree Camel, MD 07/14/13 [phone]

## 2013-07-14 NOTE — ED Notes (Signed)
Was seen here yesterday and mc er for [place],  Was then sent home ,last pm bleeding increased, passing clotts and maybe tissue

## 2013-07-14 NOTE — ED Notes (Signed)
Vaginal bleeding,pregnant.  Seen several times for same but states was worse today and she thinks she passed tissue at home

## 2013-07-20 ENCOUNTER — Other Ambulatory Visit (HOSPITAL_COMMUNITY): Payer: Self-pay | Admitting: Emergency Medicine

## 2013-07-20 ENCOUNTER — Ambulatory Visit (HOSPITAL_COMMUNITY)
Admission: RE | Admit: 2013-07-20 | Discharge: 2013-07-20 | Disposition: A | Discharge status: Home / Self Care | Payer: Medicaid Other | Source: Ambulatory Visit | Attending: Emergency Medicine | Admitting: Emergency Medicine

## 2013-07-20 DIAGNOSIS — O034 Incomplete spontaneous abortion without complication: Secondary | ICD-10-CM

## 2013-07-20 DIAGNOSIS — O200 Threatened abortion: Secondary | ICD-10-CM | POA: Insufficient documentation

## 2014-04-07 IMAGING — US US TRANSVAGINAL NON-OB
1 series · 13 of 25 positions shown · non-contrast
Comparison: 07/14/2013

CLINICAL DATA: Miscarriage. Decreasing quantitative beta HCG
levels. Possible gestational sac seen on 07/14/2013. Follow-up.

EXAM:
TRANSVAGINAL ULTRASOUND OF PELVIS
TECHNIQUE: Transvaginal ultrasound examination of the pelvis was performed
including evaluation of the uterus, ovaries, adnexal regions, and
pelvic cul-de-sac. Transvaginal imaging was necessary to visualize
the endometrium.

[Series 1: us ob transvaginal · 35 acquisitions, 13 frames shown]
[im 1/35]
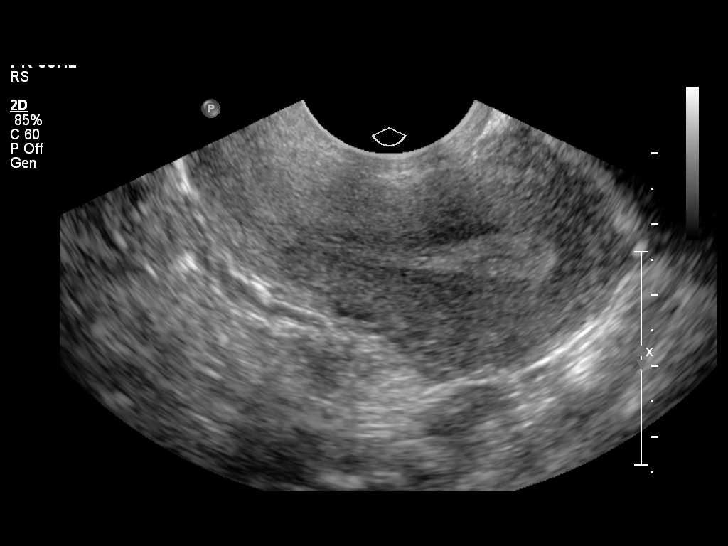
[im 3/35]
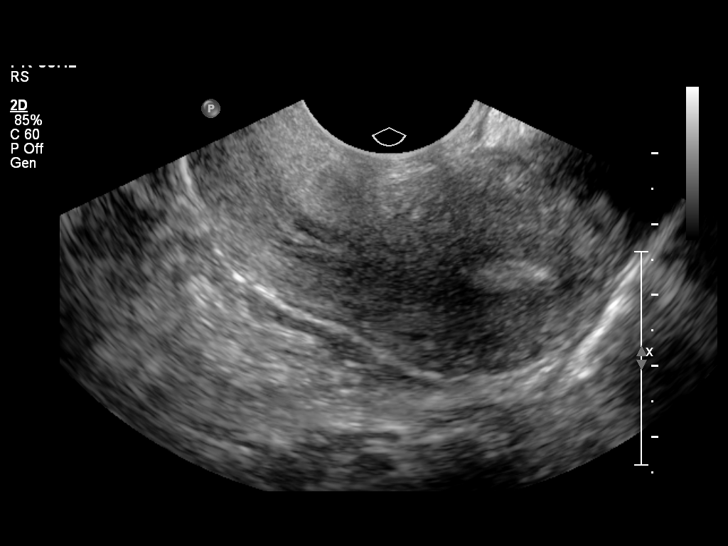
[im 6/35]
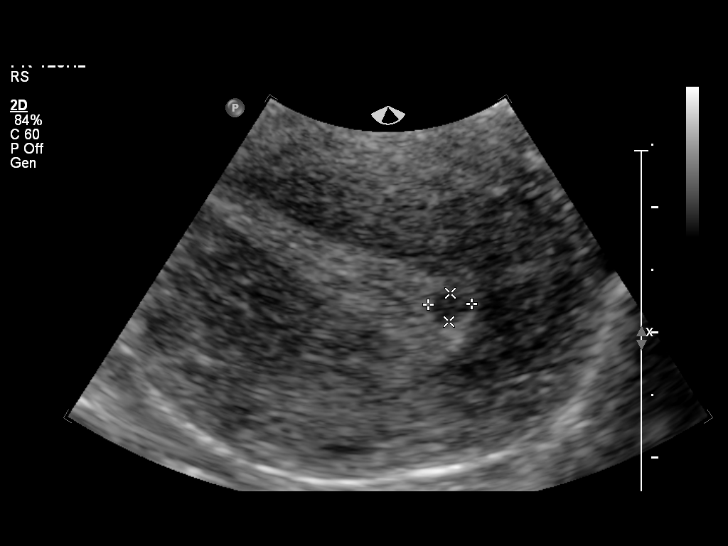
[im 9/35]
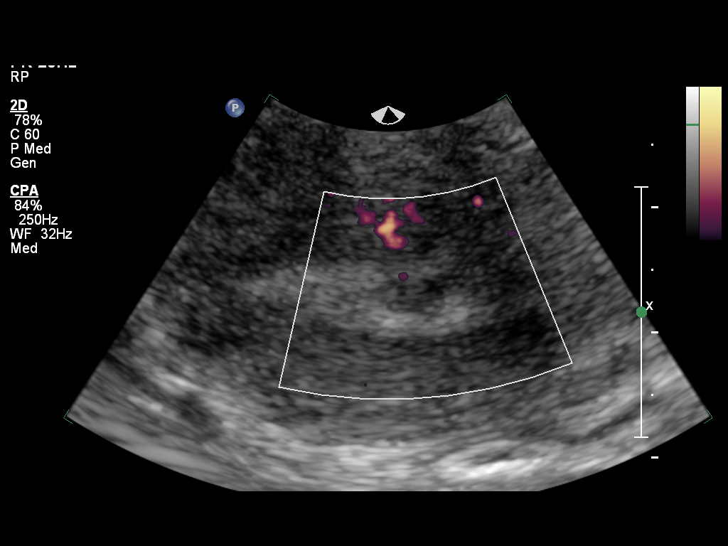
[im 12/35]
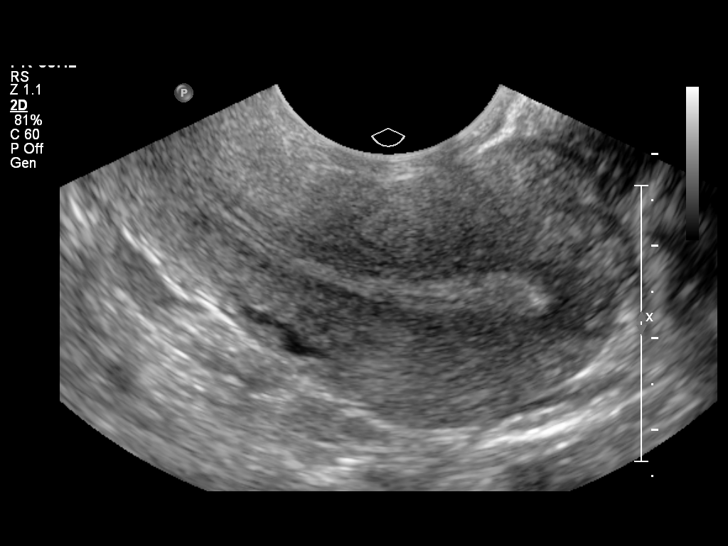
[im 15/35]
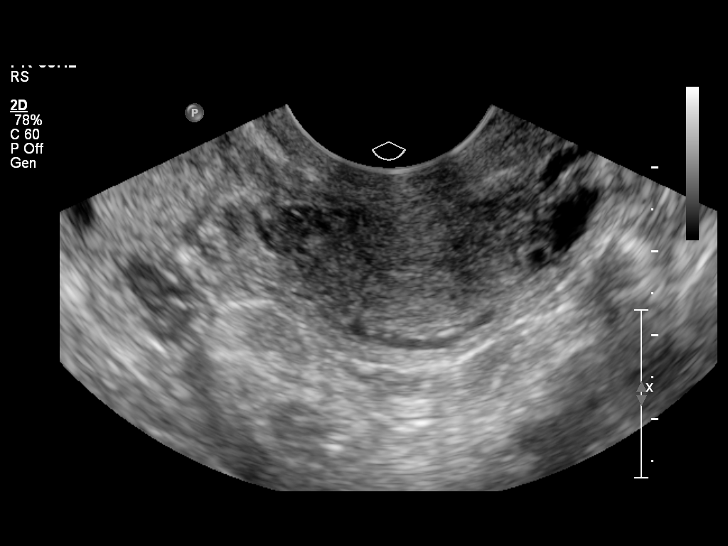
[im 18/35]
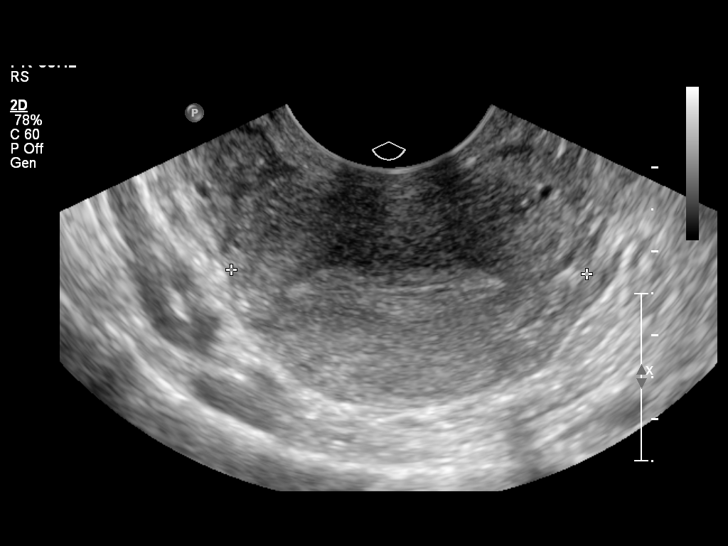
[im 20/35]
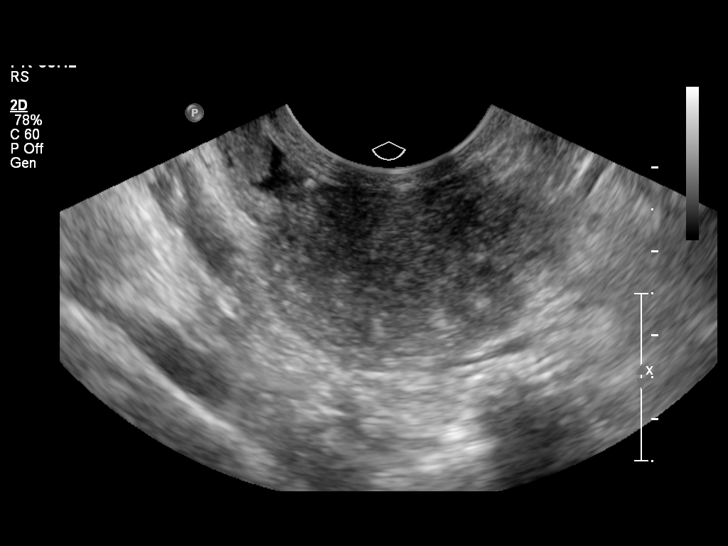
[im 23/35]
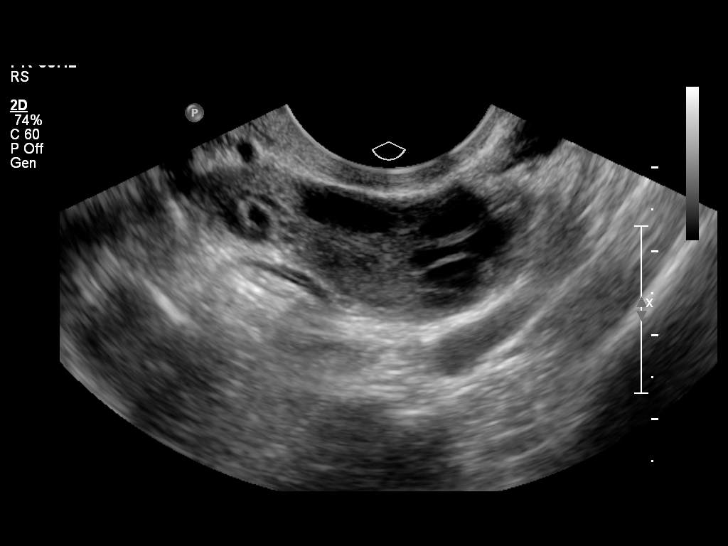
[im 26/35]
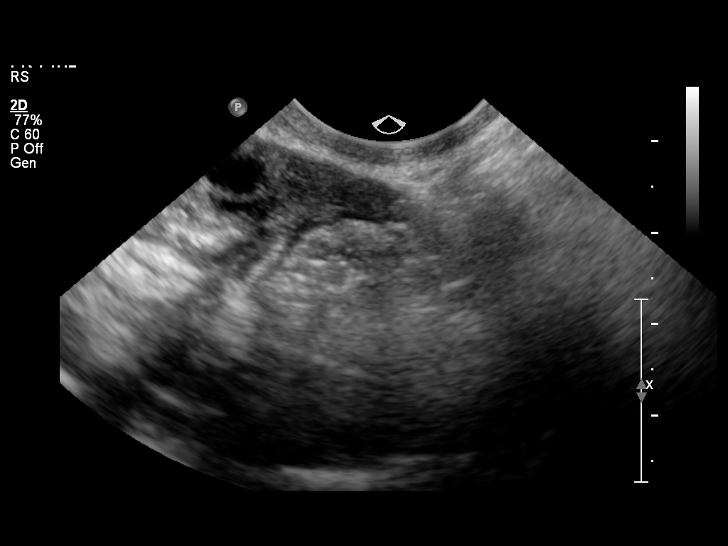
[im 29/35]
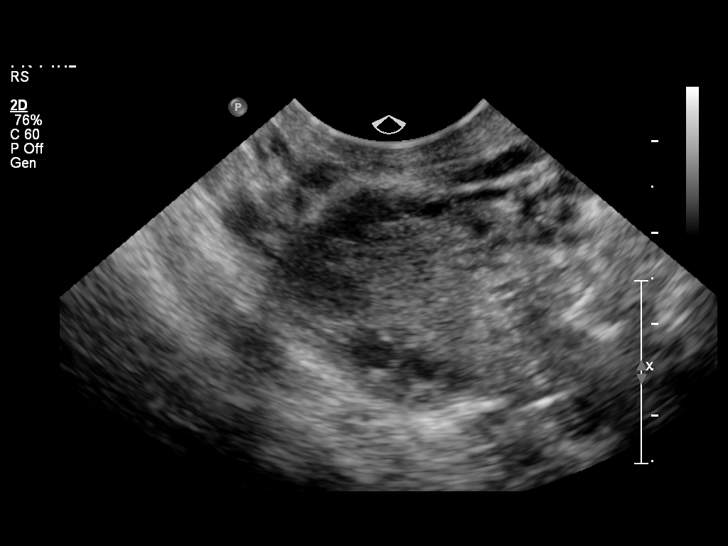
[im 32/35]
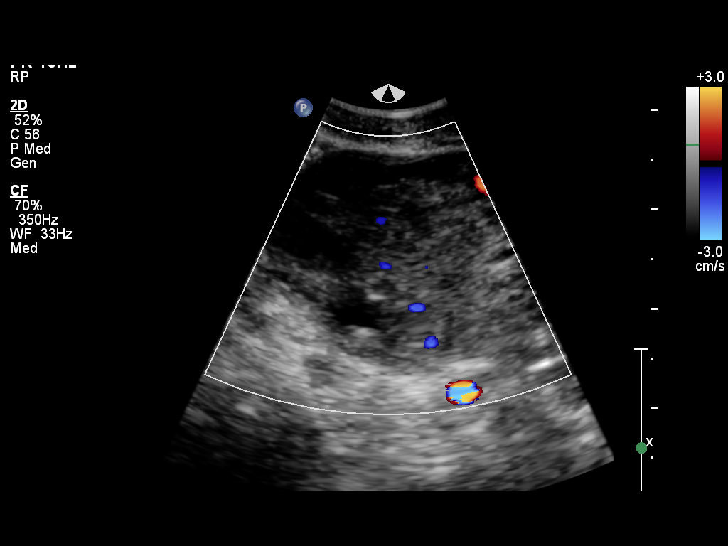
[im 35/35]
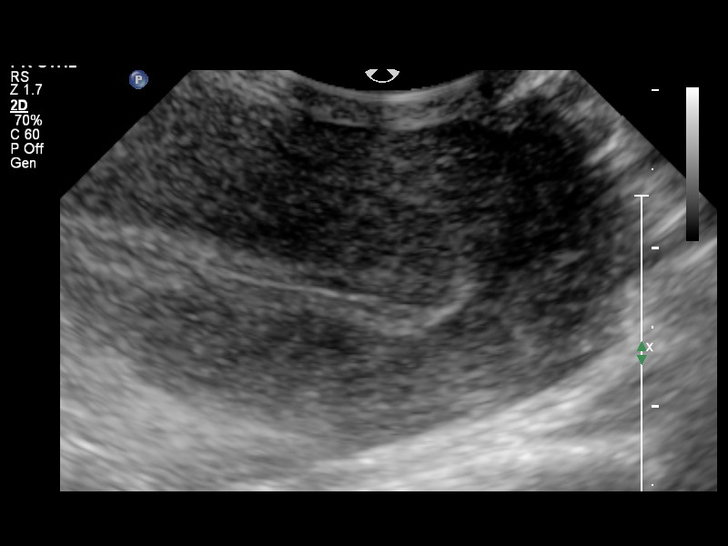

[13 of 25 positions shown; findings below may reference images not displayed]

FINDINGS: Uterus

Measurements: 5.7 x 2.9 x 4.3 cm. Uterus is retroverted.. No
fibroids or other mass visualized.

Endometrium

Thickness: 4 mm in thickness. Again visualized within the
endometrium in the fundus is a hypoechoic area. This measures
maximally 4 mm. This could reflect a small submucosal fibroid or
endometrial polyp..

Right ovary

Measurements: 2.7 x 1.8 x 2.5 cm. Normal appearance/no adnexal mass.

Left ovary

Measurements: 3.6 x 1.6 x 2.9 cm. Normal appearance/no adnexal mass.

Other findings:  No free fluid
IMPRESSION: Hypoechoic area within the endometrium at the fundus for cysts and
appears solid with possible internal blood flow. I suspect this
represents a submucosal fibroid or a polyp. No intrauterine
pregnancy visualized.

## 2014-07-10 ENCOUNTER — Encounter (HOSPITAL_BASED_OUTPATIENT_CLINIC_OR_DEPARTMENT_OTHER): Payer: Self-pay | Admitting: Emergency Medicine

## 2015-07-28 ENCOUNTER — Emergency Department (HOSPITAL_BASED_OUTPATIENT_CLINIC_OR_DEPARTMENT_OTHER)
Admission: EM | Admit: 2015-07-28 | Discharge: 2015-07-28 | Disposition: A | Discharge status: Home / Self Care | Payer: 59 | Attending: Emergency Medicine | Admitting: Emergency Medicine

## 2015-07-28 ENCOUNTER — Emergency Department (HOSPITAL_BASED_OUTPATIENT_CLINIC_OR_DEPARTMENT_OTHER): Payer: 59

## 2015-07-28 ENCOUNTER — Encounter (HOSPITAL_BASED_OUTPATIENT_CLINIC_OR_DEPARTMENT_OTHER): Payer: Self-pay | Admitting: *Deleted

## 2015-07-28 DIAGNOSIS — Z792 Long term (current) use of antibiotics: Secondary | ICD-10-CM | POA: Diagnosis not present

## 2015-07-28 DIAGNOSIS — R110 Nausea: Secondary | ICD-10-CM | POA: Insufficient documentation

## 2015-07-28 DIAGNOSIS — R1031 Right lower quadrant pain: Secondary | ICD-10-CM | POA: Diagnosis present

## 2015-07-28 DIAGNOSIS — N83201 Unspecified ovarian cyst, right side: Secondary | ICD-10-CM | POA: Diagnosis not present

## 2015-07-28 DIAGNOSIS — Z79899 Other long term (current) drug therapy: Secondary | ICD-10-CM | POA: Diagnosis not present

## 2015-07-28 DIAGNOSIS — Z3202 Encounter for pregnancy test, result negative: Secondary | ICD-10-CM | POA: Diagnosis not present

## 2015-07-28 LAB — CBC WITH DIFFERENTIAL/PLATELET
BASOS PCT: 0 %
Basophils Absolute: 0 10*3/uL (ref 0.0–0.1)
Eosinophils Absolute: 0.2 10*3/uL (ref 0.0–0.7)
Eosinophils Relative: 2 %
HCT: 37 % (ref 36.0–46.0)
HEMOGLOBIN: 12 g/dL (ref 12.0–15.0)
LYMPHS PCT: 23 %
Lymphs Abs: 1.8 10*3/uL (ref 0.7–4.0)
MCH: 22.8 pg — AB (ref 26.0–34.0)
MCHC: 32.4 g/dL (ref 30.0–36.0)
MCV: 70.3 fL — ABNORMAL LOW (ref 78.0–100.0)
MONO ABS: 0.4 10*3/uL (ref 0.1–1.0)
Monocytes Relative: 5 %
NEUTROS ABS: 5.5 10*3/uL (ref 1.7–7.7)
Neutrophils Relative %: 70 %
Platelets: 312 10*3/uL (ref 150–400)
RBC: 5.26 MIL/uL — ABNORMAL HIGH (ref 3.87–5.11)
RDW: 14.4 % (ref 11.5–15.5)
WBC: 7.9 10*3/uL (ref 4.0–10.5)

## 2015-07-28 LAB — COMPREHENSIVE METABOLIC PANEL
ALT: 20 U/L (ref 14–54)
AST: 21 U/L (ref 15–41)
Albumin: 4.2 g/dL (ref 3.5–5.0)
Alkaline Phosphatase: 94 U/L (ref 38–126)
Anion gap: 5 (ref 5–15)
BILIRUBIN TOTAL: 0.7 mg/dL (ref 0.3–1.2)
BUN: 8 mg/dL (ref 6–20)
CHLORIDE: 111 mmol/L (ref 101–111)
CO2: 24 mmol/L (ref 22–32)
Calcium: 9.5 mg/dL (ref 8.9–10.3)
Creatinine, Ser: 0.64 mg/dL (ref 0.44–1.00)
Glucose, Bld: 87 mg/dL (ref 65–99)
Potassium: 3.3 mmol/L — ABNORMAL LOW (ref 3.5–5.1)
Sodium: 140 mmol/L (ref 135–145)
TOTAL PROTEIN: 7.4 g/dL (ref 6.5–8.1)

## 2015-07-28 LAB — URINALYSIS, ROUTINE W REFLEX MICROSCOPIC
Bilirubin Urine: NEGATIVE
GLUCOSE, UA: NEGATIVE mg/dL
HGB URINE DIPSTICK: NEGATIVE
KETONES UR: NEGATIVE mg/dL
Leukocytes, UA: NEGATIVE
Nitrite: NEGATIVE
Protein, ur: NEGATIVE mg/dL
Specific Gravity, Urine: 1.028 (ref 1.005–1.030)
pH: 6 (ref 5.0–8.0)

## 2015-07-28 LAB — PREGNANCY, URINE: Preg Test, Ur: NEGATIVE

## 2015-07-28 LAB — I-STAT CG4 LACTIC ACID, ED: LACTIC ACID, VENOUS: 1.45 mmol/L (ref 0.5–2.0)

## 2015-07-28 LAB — LIPASE, BLOOD: LIPASE: 30 U/L (ref 11–51)

## 2015-07-28 MED ORDER — IBUPROFEN 600 MG PO TABS: 600.0000 mg | ORAL_TABLET | Freq: Four times a day (QID) | ORAL | Status: DC | PRN

## 2015-07-28 MED ORDER — IOHEXOL 300 MG/ML  SOLN
100.0000 mL | Freq: Once | INTRAMUSCULAR | Status: AC | PRN
  Administered 2015-07-28: 100 mL via INTRAVENOUS

## 2015-07-28 MED ORDER — SODIUM CHLORIDE 0.9 % IV BOLUS (SEPSIS)
1000.0000 mL | Freq: Once | INTRAVENOUS | Status: AC
  Administered 2015-07-28: 1000 mL via INTRAVENOUS

## 2015-07-28 MED ORDER — ONDANSETRON HCL 4 MG/2ML IJ SOLN
4.0000 mg | INTRAMUSCULAR | Status: DC | PRN
  Administered 2015-07-28: 4 mg via INTRAVENOUS
  Filled 2015-07-28: qty 2

## 2015-07-28 MED ORDER — FENTANYL CITRATE (PF) 100 MCG/2ML IJ SOLN
50.0000 ug | INTRAMUSCULAR | Status: DC | PRN
  Administered 2015-07-28: 50 ug via INTRAVENOUS
  Filled 2015-07-28: qty 2

## 2015-07-28 NOTE — ED Notes (Signed)
Awoke at approx 0700hrs with abd pain, describes as cramping. Had some nausea, no vomiting, no diarrhea

## 2015-07-28 NOTE — ED Notes (Signed)
Returns from radiology, sr x 2 remain up on stretcher, call bell within reach, family at side.

## 2015-07-28 NOTE — ED Notes (Signed)
Cont POX and int NBP placed back on pt

## 2015-07-28 NOTE — Discharge Instructions (Signed)
Ovarian Cyst An ovarian cyst is a fluid-filled sac that forms on an ovary. The ovaries are small organs that produce eggs in women. Various types of cysts can form on the ovaries. Most are not cancerous. Many do not cause problems, and they often go away on their own. Some may cause symptoms and require treatment. Common types of ovarian cysts include:  Functional cysts--These cysts may occur every month during the menstrual cycle. This is normal. The cysts usually go away with the next menstrual cycle if the woman does not get pregnant. Usually, there are no symptoms with a functional cyst.  Endometrioma cysts--These cysts form from the tissue that lines the uterus. They are also called "chocolate cysts" because they become filled with blood that turns [place]. This type of cyst can cause pain in the lower abdomen during intercourse and with your menstrual period.  Cystadenoma cysts--This type develops from the cells on the outside of the ovary. These cysts can get very big and cause lower abdomen pain and pain with intercourse. This type of cyst can twist on itself, cut off its blood supply, and cause severe pain. It can also easily rupture and cause a lot of pain.  Dermoid cysts--This type of cyst is sometimes found in both ovaries. These cysts may contain different kinds of body tissue, such as skin, teeth, hair, or cartilage. They usually do not cause symptoms unless they get very big.  Theca lutein cysts--These cysts occur when too much of a certain hormone (human chorionic gonadotropin) is produced and overstimulates the ovaries to produce an egg. This is most common after procedures used to assist with the conception of a baby (in vitro fertilization). CAUSES   Fertility drugs can cause a condition in which multiple large cysts are formed on the ovaries. This is called ovarian hyperstimulation syndrome.  A condition called polycystic ovary syndrome can cause hormonal imbalances that can lead to  nonfunctional ovarian cysts. SIGNS AND SYMPTOMS  Many ovarian cysts do not cause symptoms. If symptoms are present, they may include:  Pelvic pain or pressure.  Pain in the lower abdomen.  Pain during sexual intercourse.  Increasing girth (swelling) of the abdomen.  Abnormal menstrual periods.  Increasing pain with menstrual periods.  Stopping having menstrual periods without being pregnant. DIAGNOSIS  These cysts are commonly found during a routine or annual pelvic exam. Tests may be ordered to find out more about the cyst. These tests may include:  Ultrasound.  X-ray of the pelvis.  CT scan.  MRI.  Blood tests. TREATMENT  Many ovarian cysts go away on their own without treatment. Your health care provider may want to check your cyst regularly for 2-3 months to see if it changes. For women in menopause, it is particularly important to monitor a cyst closely because of the higher rate of ovarian cancer in menopausal women. When treatment is needed, it may include any of the following:  A procedure to drain the cyst (aspiration). This may be done using a long needle and ultrasound. It can also be done through a laparoscopic procedure. This involves using a thin, lighted tube with a tiny camera on the end (laparoscope) inserted through a small incision.  Surgery to remove the whole cyst. This may be done using laparoscopic surgery or an open surgery involving a larger incision in the lower abdomen.  Hormone treatment or birth control pills. These methods are sometimes used to help dissolve a cyst. HOME CARE INSTRUCTIONS   Only take over-the-counter   or prescription medicines as directed by your health care provider.  Follow up with your health care provider as directed.  Get regular pelvic exams and Pap tests. SEEK MEDICAL CARE IF:   Your periods are late, irregular, or painful, or they stop.  Your pelvic pain or abdominal pain does not go away.  Your abdomen becomes  larger or swollen.  You have pressure on your bladder or trouble emptying your bladder completely.  You have pain during sexual intercourse.  You have feelings of fullness, pressure, or discomfort in your stomach.  You lose weight for no apparent reason.  You feel generally ill.  You become constipated.  You lose your appetite.  You develop acne.  You have an increase in body and facial hair.  You are gaining weight, without changing your exercise and eating habits.  You think you are pregnant. SEEK IMMEDIATE MEDICAL CARE IF:   You have increasing abdominal pain.  You feel sick to your stomach (nauseous), and you throw up (vomit).  You develop a fever that comes on suddenly.  You have abdominal pain during a bowel movement.  Your menstrual periods become heavier than usual. MAKE SURE YOU:  Understand these instructions.  Will watch your condition.  Will get help right away if you are not doing well or get worse.   This information is not intended to replace advice given to you by your health care provider. Make sure you discuss any questions you have with your health care provider.   Document Released: 08/25/2005 Document Revised: 08/30/2013 Document Reviewed: 05/02/2013 Elsevier Interactive Patient Education 2016 [employer]  

## 2015-07-28 NOTE — ED Notes (Signed)
Pt states pain is more localized to left lower quad of abdomen

## 2015-07-28 NOTE — ED Notes (Signed)
NPO SINCE 0300HR TODAY

## 2015-07-28 NOTE — ED Notes (Signed)
Placed on cont pox and int NBP monitoring, sr x 2 up, callbell with in reach, family at side

## 2015-07-28 NOTE — ED Provider Notes (Signed)
CSN: [id number]     Arrival date & time 07/28/15  [id number] History   First MD Initiated Contact with Patient 07/28/15 [phone]     Chief Complaint  Patient presents with  . Abdominal Pain     (Consider location/radiation/quality/duration/timing/severity/associated sxs/prior Treatment) Patient is a 29 y.o. female presenting with abdominal pain. The history is provided by the patient.  Abdominal Pain Pain location:  RLQ Pain quality: sharp   Pain radiates to:  Does not radiate Pain severity:  Moderate Onset quality:  Gradual Duration:  3 hours Timing:  Constant Progression:  Unchanged Chronicity:  New Context: alcohol use   Context: not diet changes, not previous surgeries and not trauma   Relieved by:  Nothing Worsened by:  Nothing tried Ineffective treatments:  None tried Associated symptoms: nausea   Associated symptoms: no anorexia, no constipation, no diarrhea, no fever, no hematemesis, no hematochezia and no vomiting     History reviewed. No pertinent past medical history. Past Surgical History  Procedure Laterality Date  . Foot surgery Bilateral    No family history on file. Social History  Substance Use Topics  . Smoking status: Never Smoker   . Smokeless tobacco: None  . Alcohol Use: No   OB History    Gravida Para Term Preterm AB TAB SAB Ectopic Multiple Living   1 0             Review of Systems  Constitutional: Negative for fever.  Gastrointestinal: Positive for nausea and abdominal pain. Negative for vomiting, diarrhea, constipation, hematochezia, anorexia and hematemesis.  All other systems reviewed and are negative.     Allergies  Review of patient's allergies indicates no known allergies.  Home Medications   Prior to Admission medications   Medication Sig Start Date End Date Taking? Authorizing Provider  cephALEXin (KEFLEX) 500 MG capsule Take 1 capsule (500 mg total) by mouth 2 (two) times daily. 07/13/13   Shon Batonourtney F Horton, MD  metoCLOPramide  (REGLAN) 10 MG tablet Take 1 tablet (10 mg total) by mouth every 6 (six) hours. 06/30/13   Linwood DibblesJon Knapp, MD  Prenatal Vit-Fe Fumarate-FA (PRENATAL MULTIVITAMIN) TABS tablet Take 1 tablet by mouth daily at 12 noon. "A prenatal vitamin", not sure what/which    Historical Provider, MD   BP 122/71 mmHg  Pulse 83  Temp(Src) 98.3 F (36.8 C) (Oral)  Resp 18  Ht 5\' 2"  (1.575 m)  Wt 161 lb (73.029 kg)  BMI 29.44 kg/m2  SpO2 100%  LMP 07/11/2015 (Exact Date) Physical Exam  Constitutional: She is oriented to person, place, and time. She appears well-developed and well-nourished. No distress.  HENT:  Head: Normocephalic.  Eyes: Conjunctivae are normal.  Neck: Neck supple. No tracheal deviation present.  Cardiovascular: Normal rate, regular rhythm and normal heart sounds.   Pulmonary/Chest: Effort normal and breath sounds normal. No respiratory distress.  Abdominal: Soft. Normal appearance. She exhibits no distension. There is tenderness in the right lower quadrant and suprapubic area. There is rebound and guarding (RLQ).  No peritonitis  Neurological: She is alert and oriented to person, place, and time.  Skin: Skin is warm and dry.  Psychiatric: She has a normal mood and affect.    ED Course  Procedures (including critical care time) Labs Review Labs Reviewed  URINALYSIS, ROUTINE W REFLEX MICROSCOPIC (NOT [facility]) - Abnormal; Notable for the following:    APPearance CLOUDY (*)    All other components within normal limits  CBC WITH DIFFERENTIAL/PLATELET - Abnormal; Notable for  the following:    RBC 5.26 (*)    MCV 70.3 (*)    MCH 22.8 (*)    All other components within normal limits  COMPREHENSIVE METABOLIC PANEL - Abnormal; Notable for the following:    Potassium 3.3 (*)    All other components within normal limits  PREGNANCY, URINE  LIPASE, BLOOD  I-STAT CG4 LACTIC ACID, ED    Imaging Review Ct Abdomen Pelvis W Contrast  07/28/2015  CLINICAL DATA:  Acute right-sided abdominal  pain and nausea. EXAM: CT ABDOMEN AND PELVIS WITH CONTRAST TECHNIQUE: Multidetector CT imaging of the abdomen and pelvis was performed using the standard protocol following bolus administration of intravenous contrast. CONTRAST:  OMNIPAQUE IOHEXOL 300 MG/ML  SOLN COMPARISON:  None. FINDINGS: Lower chest:  Normal. Hepatobiliary: Normal. Pancreas: Normal. Spleen: Normal. Adrenals/Urinary Tract: Normal adrenal glands. 8 mm simple cyst on the upper pole of the left kidney. Kidneys are otherwise normal. Bladder is normal. Stomach/Bowel: Normal.  The appendix is not visible. Vascular/Lymphatic: Normal. Reproductive: Small collapsed cysts or follicles on the right ovary. Left ovary and uterus are normal. Other: Small amount free fluid in the pelvis, typical for a female of this age. No free air. Musculoskeletal: Normal. IMPRESSION: Small collapsed follicles or cysts on the right ovary. The appendix is not visible but there is no inflammatory process in the right lower quadrant. Terminal ileum and cecum appear normal. Electronically Signed   By: Francene Boyers M.D.   On: 07/28/2015 11:42   I have personally reviewed and evaluated these images and lab results as part of my medical decision-making.   EKG Interpretation None      MDM   Final diagnoses:  Ovarian cyst, right    29 year old female presents with abdominal pain starting this morning in the right lower quadrant. Initially she says left side and points to her right side then corrects herself. She has a known history of ovarian cysts with removal of one on the left recently but no other major surgical history. She has some focal tenderness in the right lower quadrant of the abdomen with rebound concerning for possible early appendicitis. No other typical features. Labs reassuring but pain is persistently localized to right lower quadrant so CT scan was ordered to rule out appendicitis.  CT scan is consistent with right ovarian cysts which are  the likely source of patient's pain. Recommended NSAIDs and other supportive care for definitive therapy and OB/GYN follow-up as needed. Plan to follow up with PCP as needed and return precautions discussed for worsening or new concerning symptoms.     Lyndal Pulley, MD 07/28/15 [phone]

## 2015-07-28 NOTE — ED Notes (Signed)
DC instructions reviewed with pt, discussed pain control and following up with primary MD, opportunity for questions provided. Mother at bedside to drive pt home

## 2015-07-28 NOTE — ED Notes (Signed)
Patient transported to CT 

## 2016-06-16 ENCOUNTER — Emergency Department (HOSPITAL_BASED_OUTPATIENT_CLINIC_OR_DEPARTMENT_OTHER): Payer: 59

## 2016-06-16 ENCOUNTER — Emergency Department (HOSPITAL_BASED_OUTPATIENT_CLINIC_OR_DEPARTMENT_OTHER)
Admission: EM | Admit: 2016-06-16 | Discharge: 2016-06-17 | Disposition: A | Discharge status: Home / Self Care | Payer: 59 | Attending: Emergency Medicine | Admitting: Emergency Medicine

## 2016-06-16 ENCOUNTER — Encounter (HOSPITAL_BASED_OUTPATIENT_CLINIC_OR_DEPARTMENT_OTHER): Payer: Self-pay | Admitting: *Deleted

## 2016-06-16 DIAGNOSIS — Y990 Civilian activity done for income or pay: Secondary | ICD-10-CM | POA: Diagnosis not present

## 2016-06-16 DIAGNOSIS — W268XXA Contact with other sharp object(s), not elsewhere classified, initial encounter: Secondary | ICD-10-CM | POA: Diagnosis not present

## 2016-06-16 DIAGNOSIS — Y939 Activity, unspecified: Secondary | ICD-10-CM | POA: Diagnosis not present

## 2016-06-16 DIAGNOSIS — S6991XA Unspecified injury of right wrist, hand and finger(s), initial encounter: Secondary | ICD-10-CM | POA: Diagnosis present

## 2016-06-16 DIAGNOSIS — S60041A Contusion of right ring finger without damage to nail, initial encounter: Secondary | ICD-10-CM | POA: Diagnosis not present

## 2016-06-16 DIAGNOSIS — Y929 Unspecified place or not applicable: Secondary | ICD-10-CM | POA: Insufficient documentation

## 2016-06-16 NOTE — ED Triage Notes (Signed)
R ring finger noted with bruising and edema at the top joint.  Pt. Able to bend with c/o pain.

## 2016-06-16 NOTE — ED Provider Notes (Signed)
[facility] Provider Note   CSN: [id number] Arrival date & time: 06/16/16  2310  By signing my name below, I, Valentino SaxonBianca Contreras, attest that this documentation has been prepared under the direction and in the presence of Roxy Horsemanobert Aven Christen, PA-C. Electronically Signed: Valentino SaxonBianca Contreras, ED Scribe. 06/16/16. 11:58 PM.  History   Chief Complaint Chief Complaint  Patient presents with  . Finger Injury   The history is provided by the patient. No language interpreter was used.    HPI Comments: Alexandra Sparks is a 30 y.o. female who presents to the Emergency Department complaining of right ring finger pain s/p injury that occured today. Pt states she crushed her ring finger in between a shelf and a piece of metal equipment at work. She notes pain is worsened with touch. Pt reports she did not try any OTC medications for relief. She denies any numbness, additional injuries.   History reviewed. No pertinent past medical history.  There are no active problems to display for this patient.   Past Surgical History:  Procedure Laterality Date  . FOOT SURGERY Bilateral     OB History    Gravida Para Term Preterm AB Living   1 0           SAB TAB Ectopic Multiple Live Births                   Home Medications    Prior to Admission medications   Medication Sig Start Date End Date Taking? Authorizing Provider  cephALEXin (KEFLEX) 500 MG capsule Take 1 capsule (500 mg total) by mouth 2 (two) times daily. 07/13/13   Shon Batonourtney F Horton, MD  ibuprofen (ADVIL,MOTRIN) 600 MG tablet Take 1 tablet (600 mg total) by mouth every 6 (six) hours as needed. 07/28/15   Lyndal Pulleyaniel Knott, MD  metoCLOPramide (REGLAN) 10 MG tablet Take 1 tablet (10 mg total) by mouth every 6 (six) hours. 06/30/13   Linwood DibblesJon Knapp, MD  Prenatal Vit-Fe Fumarate-FA (PRENATAL MULTIVITAMIN) TABS tablet Take 1 tablet by mouth daily at 12 noon. "A prenatal vitamin", not sure what/which    Historical Provider, MD    Family  History No family history on file.  Social History Social History  Substance Use Topics  . Smoking status: Never Smoker  . Smokeless tobacco: Not on file  . Alcohol use No     Allergies   Review of patient's allergies indicates no known allergies.   Review of Systems Review of Systems  Musculoskeletal: Positive for arthralgias (right ring finger).  Neurological: Negative for numbness.     Physical Exam Updated Vital Signs BP 117/60 (BP Location: Left Arm)   Pulse 74   Temp 98.2 F (36.8 C) (Oral)   Resp 18   Ht 5\' 2"  (1.575 m)   Wt 163 lb (73.9 kg)   LMP 05/16/2016 (LMP Unknown)   SpO2 100%   BMI 29.81 kg/m   Physical Exam  Physical Exam  Constitutional: Pt appears well-developed and well-nourished. No distress.  HENT:  Head: Normocephalic and atraumatic.  Eyes: Conjunctivae are normal.  Neck: Normal range of motion.  Cardiovascular: Normal rate, regular rhythm and intact distal pulses.   Capillary refill < 3 sec  Pulmonary/Chest: Effort normal and breath sounds normal.  Musculoskeletal: Pt exhibits tenderness to palpation of right ring distal phalanx. Pt exhibits no edema.  ROM: 5/5  Neurological: Pt  is alert. Coordination normal.  Sensation 5/5 Strength 5/5  Skin: Skin is warm and dry. Pt is not  diaphoretic.  No tenting of the skin  Psychiatric: Pt has a normal mood and affect.  Nursing note and vitals reviewed.  ED Treatments / Results   DIAGNOSTIC STUDIES: Oxygen Saturation is 100% on RA, normal by my interpretation.    COORDINATION OF CARE: 11:52 PM Discussed treatment plan with pt at bedside which includes splint and pain medication and pt agreed to plan.   Labs (all labs ordered are listed, but only abnormal results are displayed) Labs Reviewed - No data to display  EKG  EKG Interpretation None       Radiology Dg Finger Ring Right  Result Date: 06/17/2016 CLINICAL DATA:  Right ring finger injury, pain in the distal phalanx  EXAM: RIGHT RING FINGER 2+V COMPARISON:  None. FINDINGS: There is no evidence of fracture or dislocation. There is no evidence of arthropathy or other focal bone abnormality. Soft tissues are unremarkable. IMPRESSION: Negative. Electronically Signed   By: Jasmine Pang M.D.   On: 06/17/2016 00:28    Procedures Procedures (including critical care time)  Medications Ordered in ED Medications - No data to display   Initial Impression / Assessment and Plan / ED Course  I have reviewed the triage vital signs and the nursing notes.  Pertinent labs & imaging results that were available during my care of the patient were reviewed by me and considered in my medical decision making (see chart for details).  Clinical Course    Patient X-Ray negative for obvious fracture or dislocation.  Pt advised to follow up with orthopedics. Patient given splint while in ED, conservative therapy recommended and discussed. Patient will be discharged home & is agreeable with above plan. Returns precautions discussed. Pt appears safe for discharge.   Final Clinical Impressions(s) / ED Diagnoses   Final diagnoses:  Contusion of right ring finger without damage to nail, initial encounter    New Prescriptions New Prescriptions   No medications on file    I personally performed the services described in this documentation, which was scribed in my presence. The recorded information has been reviewed and is accurate.       Roxy Horseman, PA-C 06/17/16 1610    Zadie Rhine, MD 06/17/16 [phone]

## 2016-10-26 ENCOUNTER — Emergency Department (HOSPITAL_BASED_OUTPATIENT_CLINIC_OR_DEPARTMENT_OTHER): Payer: 59

## 2016-10-26 ENCOUNTER — Emergency Department (HOSPITAL_BASED_OUTPATIENT_CLINIC_OR_DEPARTMENT_OTHER)
Admission: EM | Admit: 2016-10-26 | Discharge: 2016-10-26 | Disposition: A | Discharge status: Home / Self Care | Payer: 59 | Attending: Physician Assistant | Admitting: Physician Assistant

## 2016-10-26 ENCOUNTER — Encounter (HOSPITAL_BASED_OUTPATIENT_CLINIC_OR_DEPARTMENT_OTHER): Payer: Self-pay | Admitting: Emergency Medicine

## 2016-10-26 DIAGNOSIS — Z79899 Other long term (current) drug therapy: Secondary | ICD-10-CM | POA: Diagnosis not present

## 2016-10-26 DIAGNOSIS — N760 Acute vaginitis: Secondary | ICD-10-CM | POA: Diagnosis not present

## 2016-10-26 DIAGNOSIS — N83202 Unspecified ovarian cyst, left side: Secondary | ICD-10-CM | POA: Diagnosis not present

## 2016-10-26 DIAGNOSIS — N83201 Unspecified ovarian cyst, right side: Secondary | ICD-10-CM

## 2016-10-26 DIAGNOSIS — Z791 Long term (current) use of non-steroidal anti-inflammatories (NSAID): Secondary | ICD-10-CM | POA: Insufficient documentation

## 2016-10-26 DIAGNOSIS — N83519 Torsion of ovary and ovarian pedicle, unspecified side: Secondary | ICD-10-CM | POA: Diagnosis not present

## 2016-10-26 DIAGNOSIS — B9689 Other specified bacterial agents as the cause of diseases classified elsewhere: Secondary | ICD-10-CM

## 2016-10-26 DIAGNOSIS — R1031 Right lower quadrant pain: Secondary | ICD-10-CM | POA: Diagnosis present

## 2016-10-26 HISTORY — DX: Unspecified ovarian cyst, unspecified side: N83.209

## 2016-10-26 LAB — URINALYSIS, ROUTINE W REFLEX MICROSCOPIC
BILIRUBIN URINE: NEGATIVE
Glucose, UA: NEGATIVE mg/dL
HGB URINE DIPSTICK: NEGATIVE
Ketones, ur: NEGATIVE mg/dL
Leukocytes, UA: NEGATIVE
Nitrite: NEGATIVE
PROTEIN: NEGATIVE mg/dL
Specific Gravity, Urine: 1.03 (ref 1.005–1.030)
pH: 6 (ref 5.0–8.0)

## 2016-10-26 LAB — WET PREP, GENITAL
Sperm: NONE SEEN
Trich, Wet Prep: NONE SEEN
Yeast Wet Prep HPF POC: NONE SEEN

## 2016-10-26 LAB — PREGNANCY, URINE: PREG TEST UR: NEGATIVE

## 2016-10-26 MED ORDER — METRONIDAZOLE 500 MG PO TABS
500.0000 mg | ORAL_TABLET | Freq: Once | ORAL | Status: AC
  Administered 2016-10-26: 500 mg via ORAL
  Filled 2016-10-26: qty 1

## 2016-10-26 MED ORDER — METRONIDAZOLE 500 MG PO TABS: 500.0000 mg | ORAL_TABLET | Freq: Two times a day (BID) | ORAL | 0 refills | Status: DC

## 2016-10-26 NOTE — ED Provider Notes (Signed)
[facility] Provider Note   CSN: [id number] Arrival date & time: 10/26/16  1436   By signing my name below, I, Soijett Blue, attest that this documentation has been prepared under the direction and in the presence of Claretha Townshend Lyn Dujuan Stankowski, MD. Electronically Signed: Soijett Blue, ED Scribe. 10/26/16. 4:15 PM.  History   Chief Complaint Chief Complaint  Patient presents with  . Abdominal Pain    HPI Alexandra Sparks is a 31 y.o. female with a PMHx of ovarian cyst, who presents to the Emergency Department complaining of intermittent RLQ abdominal pain onset 3 days and worsening today. Pt hasn't tried any medications for the relief of her symptoms. Pt notes that she has a hx of a ruptured right ovarian cyst and her current symptoms are similar. Patient's last menstrual period was 10/09/2016. Pt notes that she is currently sexually active and she doesn't use birth control. Pt reports that she has had a miscarriage. She denies nausea, vomiting, diarrhea, vaginal discharge, dysuria, and any other symptoms.     The history is provided by the patient. No language interpreter was used.    Past Medical History:  Diagnosis Date  . Ovarian cyst     There are no active problems to display for this patient.   Past Surgical History:  Procedure Laterality Date  . FOOT SURGERY Bilateral     OB History    Gravida Para Term Preterm AB Living   1 0           SAB TAB Ectopic Multiple Live Births                   Home Medications    Prior to Admission medications   Medication Sig Start Date End Date Taking? Authorizing Provider  cephALEXin (KEFLEX) 500 MG capsule Take 1 capsule (500 mg total) by mouth 2 (two) times daily. 07/13/13   Shon Batonourtney F Horton, MD  ibuprofen (ADVIL,MOTRIN) 600 MG tablet Take 1 tablet (600 mg total) by mouth every 6 (six) hours as needed. 07/28/15   Lyndal Pulleyaniel Knott, MD  metoCLOPramide (REGLAN) 10 MG tablet Take 1 tablet (10 mg total) by mouth every 6 (six)  hours. 06/30/13   Linwood DibblesJon Knapp, MD  Prenatal Vit-Fe Fumarate-FA (PRENATAL MULTIVITAMIN) TABS tablet Take 1 tablet by mouth daily at 12 noon. "A prenatal vitamin", not sure what/which    Historical Provider, MD    Family History No family history on file.  Social History Social History  Substance Use Topics  . Smoking status: Never Smoker  . Smokeless tobacco: Never Used  . Alcohol use Yes     Comment: socially     Allergies   Patient has no known allergies.   Review of Systems Review of Systems  Gastrointestinal: Positive for abdominal pain. Negative for diarrhea, nausea and vomiting.  Genitourinary: Negative for dysuria and vaginal discharge.     Physical Exam Updated Vital Signs BP 112/68 (BP Location: Right Arm)   Pulse 79   Temp 98.1 F (36.7 C) (Oral)   Resp 16   Ht 5\' 2"  (1.575 m)   Wt 162 lb (73.5 kg)   LMP 10/16/2016   SpO2 100%   Breastfeeding? Unknown   BMI 29.63 kg/m   Physical Exam  Constitutional: She is oriented to person, place, and time. She appears well-developed and well-nourished. No distress.  HENT:  Head: Normocephalic and atraumatic.  Eyes: EOM are normal.  Neck: Neck supple.  Cardiovascular: Normal rate.   Pulmonary/Chest: Effort normal.  No respiratory distress.  Abdominal: Soft. She exhibits no distension. There is no tenderness.  Genitourinary: Cervix exhibits no motion tenderness. Right adnexum displays tenderness. Left adnexum displays no tenderness. Vaginal discharge found.  Genitourinary Comments: Chaperone present for exam. Mild white vaginal discharge. Mild right adnexal tenderness. No CMT.  Musculoskeletal: Normal range of motion.  Neurological: She is alert and oriented to person, place, and time.  Skin: Skin is warm and dry.  Psychiatric: She has a normal mood and affect. Her behavior is normal.  Nursing note and vitals reviewed.    ED Treatments / Results  DIAGNOSTIC STUDIES: Oxygen Saturation is 98% on RA, nl by my  interpretation.    COORDINATION OF CARE: 4:13 PM Discussed treatment plan with pt at bedside which includes UA, pelvic exam, US pelvis, US transvaginal, [place] art/ven flow abd pelv doppler, and pt agreed to plan.   Labs (all labs ordered are listed, but only abnormal results are displayed) Labs Reviewed  WET PREP, GENITAL - Abnormal; Notable for the following:       Result Value   Clue Cells Wet Prep HPF POC PRESENT (*)    WBC, Wet Prep HPF POC MANY (*)    All other components within normal limits  PREGNANCY, URINE  URINALYSIS, ROUTINE W REFLEX MICROSCOPIC  GC/CHLAMYDIA PROBE AMP ([place]) NOT AT [facility]    Radiology US Transvaginal Non-ob  Result Date: 10/26/2016 CLINICAL DATA:  Patient with right lower quadrant pain for 2 days. EXAM: TRANSABDOMINAL AND TRANSVAGINAL ULTRASOUND OF PELVIS DOPPLER ULTRASOUND OF OVARIES TECHNIQUE: Both transabdominal and transvaginal ultrasound examinations of the pelvis were performed. Transabdominal technique was performed for global imaging of the pelvis including uterus, ovaries, adnexal regions, and pelvic cul-de-sac. It was necessary to proceed with endovaginal exam following the transabdominal exam to visualize the endometrium and adnexal structures. Color and duplex Doppler ultrasound was utilized to evaluate blood flow to the ovaries. COMPARISON:  CT abdomen pelvis 07/28/2015 pelvic ultrasound 06/28/2015. FINDINGS: Uterus Measurements: 7.8 x 3.6 x 4.6 cm. Retroverted. No fibroids or other mass visualized. Endometrium Thickness: 12 mm. Within the superior aspect of the endometrium near the fundus there is a 5 x 5 x 4 mm hypoechoic mass with internal color vascularity. This is stable in appearance dating back to 07/14/2013. Right ovary Measurements: 4.6 x 3.7 x 4.7 cm. There is a 4.3 x 3.4 x 4.0 cm complex cystic lesion with thin internal septations within the right ovary. Left ovary Measurements: 4.7 x 3.4 x 3.7 cm. There is a 3.9 x 2.3 x 2.7 cm complex  cystic lesion with thin internal septations within the left ovary. Additionally there is a probable partially exophytic cyst off the left ovary versus paraovarian cyst measuring 11 mm. Pulsed Doppler evaluation of both ovaries demonstrates normal low-resistance arterial and venous waveforms. Other findings:  None. IMPRESSION: Indeterminate hypoechoic lesion within the endometrium near the uterine fundus which may represent polyp versus submucosal fibroid. This is stable dating back to 07/20/2013, suggestive of benign etiology. Bilateral complex cystic lesions within the right and left ovaries with lace-like internal echogenicity, favored to represent hemorrhagic cysts. Short-interval follow up ultrasound in 6-12 weeks is recommended, preferably during the week following the patient's normal menses. Electronically Signed   By: Annia Belt M.D.   On: 10/26/2016 17:11   US Pelvis Complete  Result Date: 10/26/2016 CLINICAL DATA:  Patient with right lower quadrant pain for 2 days. EXAM: TRANSABDOMINAL AND TRANSVAGINAL ULTRASOUND OF PELVIS DOPPLER ULTRASOUND OF OVARIES TECHNIQUE: Both transabdominal  and transvaginal ultrasound examinations of the pelvis were performed. Transabdominal technique was performed for global imaging of the pelvis including uterus, ovaries, adnexal regions, and pelvic cul-de-sac. It was necessary to proceed with endovaginal exam following the transabdominal exam to visualize the endometrium and adnexal structures. Color and duplex Doppler ultrasound was utilized to evaluate blood flow to the ovaries. COMPARISON:  CT abdomen pelvis 07/28/2015 pelvic ultrasound 06/28/2015. FINDINGS: Uterus Measurements: 7.8 x 3.6 x 4.6 cm. Retroverted. No fibroids or other mass visualized. Endometrium Thickness: 12 mm. Within the superior aspect of the endometrium near the fundus there is a 5 x 5 x 4 mm hypoechoic mass with internal color vascularity. This is stable in appearance dating back to 07/14/2013.  Right ovary Measurements: 4.6 x 3.7 x 4.7 cm. There is a 4.3 x 3.4 x 4.0 cm complex cystic lesion with thin internal septations within the right ovary. Left ovary Measurements: 4.7 x 3.4 x 3.7 cm. There is a 3.9 x 2.3 x 2.7 cm complex cystic lesion with thin internal septations within the left ovary. Additionally there is a probable partially exophytic cyst off the left ovary versus paraovarian cyst measuring 11 mm. Pulsed Doppler evaluation of both ovaries demonstrates normal low-resistance arterial and venous waveforms. Other findings:  None. IMPRESSION: Indeterminate hypoechoic lesion within the endometrium near the uterine fundus which may represent polyp versus submucosal fibroid. This is stable dating back to 07/20/2013, suggestive of benign etiology. Bilateral complex cystic lesions within the right and left ovaries with lace-like internal echogenicity, favored to represent hemorrhagic cysts. Short-interval follow up ultrasound in 6-12 weeks is recommended, preferably during the week following the patient's normal menses. Electronically Signed   By: Annia Belt M.D.   On: 10/26/2016 17:11   [place] Art/ven Flow Abd Pelv Doppler  Result Date: 10/26/2016 CLINICAL DATA:  Patient with right lower quadrant pain for 2 days. EXAM: TRANSABDOMINAL AND TRANSVAGINAL ULTRASOUND OF PELVIS DOPPLER ULTRASOUND OF OVARIES TECHNIQUE: Both transabdominal and transvaginal ultrasound examinations of the pelvis were performed. Transabdominal technique was performed for global imaging of the pelvis including uterus, ovaries, adnexal regions, and pelvic cul-de-sac. It was necessary to proceed with endovaginal exam following the transabdominal exam to visualize the endometrium and adnexal structures. Color and duplex Doppler ultrasound was utilized to evaluate blood flow to the ovaries. COMPARISON:  CT abdomen pelvis 07/28/2015 pelvic ultrasound 06/28/2015. FINDINGS: Uterus Measurements: 7.8 x 3.6 x 4.6 cm. Retroverted. No fibroids or  other mass visualized. Endometrium Thickness: 12 mm. Within the superior aspect of the endometrium near the fundus there is a 5 x 5 x 4 mm hypoechoic mass with internal color vascularity. This is stable in appearance dating back to 07/14/2013. Right ovary Measurements: 4.6 x 3.7 x 4.7 cm. There is a 4.3 x 3.4 x 4.0 cm complex cystic lesion with thin internal septations within the right ovary. Left ovary Measurements: 4.7 x 3.4 x 3.7 cm. There is a 3.9 x 2.3 x 2.7 cm complex cystic lesion with thin internal septations within the left ovary. Additionally there is a probable partially exophytic cyst off the left ovary versus paraovarian cyst measuring 11 mm. Pulsed Doppler evaluation of both ovaries demonstrates normal low-resistance arterial and venous waveforms. Other findings:  None. IMPRESSION: Indeterminate hypoechoic lesion within the endometrium near the uterine fundus which may represent polyp versus submucosal fibroid. This is stable dating back to 07/20/2013, suggestive of benign etiology. Bilateral complex cystic lesions within the right and left ovaries with lace-like internal echogenicity, favored to represent hemorrhagic cysts.  Short-interval follow up ultrasound in 6-12 weeks is recommended, preferably during the week following the patient's normal menses. Electronically Signed   By: Annia Belt M.D.   On: 10/26/2016 17:11    Procedures Procedures (including critical care time)  Medications Ordered in ED Medications - No data to display   Initial Impression / Assessment and Plan / ED Course  I have reviewed the triage vital signs and the nursing notes.  Pertinent labs & imaging results that were available during my care of the patient were reviewed by me and considered in my medical decision making (see chart for details).     Patient is a very pleasant 31 year old female presenting with right-sided adnexal pain. Patient has history of cysts. On vaginal exam she has mild discharge.  Evidence of bacterial vaginosis with clue cells. Ultrasound shows bilateral cysts. We will have her follow up with OB/GYN to make sure that they resolved. Otherwise, take ibuprofen and Tylenol as needed. We'll treat BV.  Patient has no fever, and appears clinically well.  Final Clinical Impressions(s) / ED Diagnoses   Final diagnoses:  Ovarian torsion  Ovarian torsion  Ovarian torsion    New Prescriptions New Prescriptions   No medications on file   I personally performed the services described in this documentation, which was scribed in my presence. The recorded information has been reviewed and is accurate.       Kobi Aller Randall An, MD 10/26/16 1758

## 2016-10-26 NOTE — Discharge Instructions (Signed)
You were seen today for ovarian cyst and bacterial vaginosis.  We should have a follow-up ultrasound in 6-12 weeks wr to make sure the cyst are improving. Please follow-up with your OB/GYN.

## 2016-10-26 NOTE — ED Notes (Signed)
Pt in ultrasound at present

## 2016-10-26 NOTE — ED Triage Notes (Signed)
RLQ pain x 3 days. States had a ruptured right ovarian cyst and the pain feels similar. Denies vag d/c, urinary s/s or n/v or diarrhea or fever.

## 2016-10-28 LAB — GC/CHLAMYDIA PROBE AMP (~~LOC~~) NOT AT ARMC
Chlamydia: NEGATIVE
NEISSERIA GONORRHEA: NEGATIVE

## 2017-10-27 ENCOUNTER — Emergency Department (HOSPITAL_BASED_OUTPATIENT_CLINIC_OR_DEPARTMENT_OTHER)
Admission: EM | Admit: 2017-10-27 | Discharge: 2017-10-27 | Disposition: A | Discharge status: Home / Self Care | Payer: 59 | Attending: Emergency Medicine | Admitting: Emergency Medicine

## 2017-10-27 ENCOUNTER — Other Ambulatory Visit: Payer: Self-pay

## 2017-10-27 ENCOUNTER — Encounter (HOSPITAL_BASED_OUTPATIENT_CLINIC_OR_DEPARTMENT_OTHER): Payer: Self-pay | Admitting: *Deleted

## 2017-10-27 DIAGNOSIS — H9201 Otalgia, right ear: Secondary | ICD-10-CM | POA: Insufficient documentation

## 2017-10-27 DIAGNOSIS — R05 Cough: Secondary | ICD-10-CM | POA: Insufficient documentation

## 2017-10-27 DIAGNOSIS — R059 Cough, unspecified: Secondary | ICD-10-CM

## 2017-10-27 MED ORDER — CETIRIZINE-PSEUDOEPHEDRINE ER 5-120 MG PO TB12: 1.0000 | ORAL_TABLET | Freq: Two times a day (BID) | ORAL | 0 refills | Status: AC

## 2017-10-27 NOTE — Discharge Instructions (Signed)
Take Zyrtec-D twice daily as needed for your ear pain.  You can also take ibuprofen or Tylenol as prescribed over-the-counter as needed for your pain.  Please help with your doctor if your symptoms are not improving by the end of the week.  Please return to emergency department if you develop any new or worsening symptoms.

## 2017-10-27 NOTE — ED Provider Notes (Signed)
[facility] EMERGENCY DEPARTMENT Provider Note   CSN: [id number] Arrival date & time: 10/27/17  1349     History   Chief Complaint Chief Complaint  Patient presents with  . Otalgia  . URI    HPI Alexandra Sparks is a 32 y.o. female with history of ovarian cyst who presents with 3-4 day history of nasal congestion, cough, sneezing, and 1 day history of right ear pain.  Patient reports her other URI symptoms are improving, however she has persistent right ear pain.  She denies fevers.  She has been taking over-the-counter medications such as NyQuil without significant relief.  She is also noticed a bump in front of her ear.  She denies any nausea, vomiting, diarrhea, abdominal pain.  HPI  Past Medical History:  Diagnosis Date  . Ovarian cyst     There are no active problems to display for this patient.   Past Surgical History:  Procedure Laterality Date  . FOOT SURGERY Bilateral     OB History    Gravida Para Term Preterm AB Living   1 0           SAB TAB Ectopic Multiple Live Births                   Home Medications    Prior to Admission medications   Medication Sig Start Date End Date Taking? Authorizing Provider  cephALEXin (KEFLEX) 500 MG capsule Take 1 capsule (500 mg total) by mouth 2 (two) times daily. 07/13/13   Horton, Mayer Masker, MD  cetirizine-pseudoephedrine (ZYRTEC-D) 5-120 MG tablet Take 1 tablet by mouth 2 (two) times daily. 10/27/17   Flavius Repsher, Waylan Boga, PA-C  ibuprofen (ADVIL,MOTRIN) 600 MG tablet Take 1 tablet (600 mg total) by mouth every 6 (six) hours as needed. 07/28/15   Lyndal Pulley, MD  metoCLOPramide (REGLAN) 10 MG tablet Take 1 tablet (10 mg total) by mouth every 6 (six) hours. 06/30/13   Linwood Dibbles, MD  metroNIDAZOLE (FLAGYL) 500 MG tablet Take 1 tablet (500 mg total) by mouth 2 (two) times daily. 10/26/16   Mackuen, Cindee Salt, MD  Prenatal Vit-Fe Fumarate-FA (PRENATAL MULTIVITAMIN) TABS tablet Take 1 tablet by mouth daily at 12  noon. "A prenatal vitamin", not sure what/which    [provider]    Family History History reviewed. No pertinent family history.  Social History Social History   Tobacco Use  . Smoking status: Never Smoker  . Smokeless tobacco: Never Used  Substance Use Topics  . Alcohol use: Yes    Comment: socially  . Drug use: No     Allergies   Patient has no known allergies.   Review of Systems Review of Systems  Constitutional: Negative for chills and fever.  HENT: Positive for congestion, rhinorrhea and sneezing. Negative for facial swelling and sore throat.   Respiratory: Positive for cough. Negative for shortness of breath.   Cardiovascular: Negative for chest pain.  Gastrointestinal: Negative for abdominal pain, diarrhea, nausea and vomiting.  Genitourinary: Negative for dysuria.  Musculoskeletal: Negative for back pain.  Skin: Negative for rash and wound.  Neurological: Negative for headaches.  Psychiatric/Behavioral: The patient is not nervous/anxious.      Physical Exam Updated Vital Signs BP 109/63 (BP Location: Right Arm)   Pulse 72   Temp 98.5 F (36.9 C) (Oral)   Resp 16   Ht 5\' 2"  (1.575 m)   Wt 68.6 kg (151 lb 3.2 oz)   LMP 09/08/2017 (Exact Date)  SpO2 100%   BMI 27.65 kg/m   Physical Exam  Constitutional: She appears well-developed and well-nourished. No distress.  HENT:  Head: Normocephalic and atraumatic.  Right Ear: Tympanic membrane normal.  Left Ear: Tympanic membrane normal.  Mouth/Throat: Oropharynx is clear and moist. No oropharyngeal exudate.  Right preauricular, mobile, palpable lymph node  Eyes: Conjunctivae are normal. Pupils are equal, round, and reactive to light. Right eye exhibits no discharge. Left eye exhibits no discharge. No scleral icterus.  Neck: Normal range of motion. Neck supple. No thyromegaly present.  Cardiovascular: Normal rate, regular rhythm, normal heart sounds and intact distal pulses. Exam reveals no  gallop and no friction rub.  No murmur heard. Pulmonary/Chest: Effort normal and breath sounds normal. No stridor. No respiratory distress. She has no wheezes. She has no rales.  Abdominal: Soft. Bowel sounds are normal. She exhibits no distension. There is no tenderness. There is no rebound and no guarding.  Musculoskeletal: She exhibits no edema.  Lymphadenopathy:    She has no cervical adenopathy.  Neurological: She is alert. Coordination normal.  Skin: Skin is warm and dry. No rash noted. She is not diaphoretic. No pallor.  Psychiatric: She has a normal mood and affect.  Nursing note and vitals reviewed.    ED Treatments / Results  Labs (all labs ordered are listed, but only abnormal results are displayed) Labs Reviewed - No data to display  EKG  EKG Interpretation None       Radiology No results found.  Procedures Procedures (including critical care time)  Medications Ordered in ED Medications - No data to display   Initial Impression / Assessment and Plan / ED Course  I have reviewed the triage vital signs and the nursing notes.  Pertinent labs & imaging results that were available during my care of the patient were reviewed by me and considered in my medical decision making (see chart for details).     Suspect ear congestion as cause of pain.  No signs of otitis media.  Patient is afebrile.  She is well-appearing.  Will treat with Zyrtec-D for congestion.  Follow-up to PCP as needed.  Return precautions discussed.  Patient understands and agrees with plan.  Patient vitals stable throughout ED course and discharged in satisfactory condition.  Final Clinical Impressions(s) / ED Diagnoses   Final diagnoses:  Right ear pain  Cough    ED Discharge Orders        Ordered    cetirizine-pseudoephedrine (ZYRTEC-D) 5-120 MG tablet  2 times daily     10/27/17 [address]50       Jordani Nunn, Plains[place] CenterAlexandra M, [place] JerseyPA-C 10/27/17 1731    Cathren LaineSteinl, Kevin, MD 10/28/17 1226

## 2017-10-27 NOTE — ED Triage Notes (Signed)
For the past couple of days pt has felt like she has a cold. Now the pain is in her ear and she feels worse.

## 2017-12-04 ENCOUNTER — Emergency Department (HOSPITAL_BASED_OUTPATIENT_CLINIC_OR_DEPARTMENT_OTHER)
Admission: EM | Admit: 2017-12-04 | Discharge: 2017-12-04 | Disposition: A | Discharge status: Home / Self Care | Payer: Self-pay | Attending: Emergency Medicine | Admitting: Emergency Medicine

## 2017-12-04 ENCOUNTER — Encounter (HOSPITAL_BASED_OUTPATIENT_CLINIC_OR_DEPARTMENT_OTHER): Payer: Self-pay | Admitting: Emergency Medicine

## 2017-12-04 ENCOUNTER — Other Ambulatory Visit: Payer: Self-pay

## 2017-12-04 DIAGNOSIS — L0100 Impetigo, unspecified: Secondary | ICD-10-CM | POA: Insufficient documentation

## 2017-12-04 DIAGNOSIS — Z79899 Other long term (current) drug therapy: Secondary | ICD-10-CM | POA: Insufficient documentation

## 2017-12-04 LAB — PREGNANCY, URINE: Preg Test, Ur: NEGATIVE

## 2017-12-04 MED ORDER — CEPHALEXIN 500 MG PO CAPS: 500.0000 mg | ORAL_CAPSULE | Freq: Four times a day (QID) | ORAL | 0 refills | Status: AC

## 2017-12-04 MED ORDER — KETOROLAC TROMETHAMINE 60 MG/2ML IM SOLN
30.0000 mg | Freq: Once | INTRAMUSCULAR | Status: AC
  Administered 2017-12-04: 30 mg via INTRAMUSCULAR
  Filled 2017-12-04: qty 2

## 2017-12-04 MED ORDER — MUPIROCIN CALCIUM 2 % EX CREA: 1.0000 "application " | TOPICAL_CREAM | Freq: Two times a day (BID) | CUTANEOUS | 0 refills | Status: AC

## 2017-12-04 NOTE — ED Triage Notes (Signed)
Patient states that she "had been having a real bad Migraine Headache and I have 2 really big knots on my head" - patient states that she has had this for about a week and half

## 2017-12-04 NOTE — ED Provider Notes (Signed)
[facility] EMERGENCY DEPARTMENT Provider Note   CSN: [id number] Arrival date & time: 12/04/17  0006     History   Chief Complaint Chief Complaint  Patient presents with  . Headache    HPI Alexandra Sparks is a 32 y.o. female.  The history is provided by the patient.  Headache   This is a new problem. The current episode started more than 2 days ago. The problem occurs constantly. The problem has not changed since onset.The headache is associated with nothing. The pain is located in the temporal region. The quality of the pain is described as dull. The pain is at a severity of 5/10. The pain is moderate. The pain does not radiate. Pertinent negatives include no anorexia, no fever, no malaise/fatigue, no chest pressure, no near-syncope, no orthopnea, no palpitations and no syncope. She has tried nothing for the symptoms. The treatment provided no relief.  Headache starts at 2 crusted elevated lesions on B temporal bones where her weave as sewn down, she also had crusting at hair line anteriorly  Past Medical History:  Diagnosis Date  . Ovarian cyst     There are no active problems to display for this patient.   Past Surgical History:  Procedure Laterality Date  . FOOT SURGERY Bilateral      OB History    Gravida  1   Para  0   Term      Preterm      AB      Living        SAB      TAB      Ectopic      Multiple      Live Births               Home Medications    Prior to Admission medications   Medication Sig Start Date End Date Taking? Authorizing Provider  cephALEXin (KEFLEX) 500 MG capsule Take 1 capsule (500 mg total) by mouth 2 (two) times daily. 07/13/13   Horton, Mayer Masker, MD  cephALEXin (KEFLEX) 500 MG capsule Take 1 capsule (500 mg total) by mouth 4 (four) times daily. 12/04/17   Joss Friedel, MD  cetirizine-pseudoephedrine (ZYRTEC-D) 5-120 MG tablet Take 1 tablet by mouth 2 (two) times daily. 10/27/17   Law, Waylan Boga, PA-C    ibuprofen (ADVIL,MOTRIN) 600 MG tablet Take 1 tablet (600 mg total) by mouth every 6 (six) hours as needed. 07/28/15   Lyndal Pulley, MD  metoCLOPramide (REGLAN) 10 MG tablet Take 1 tablet (10 mg total) by mouth every 6 (six) hours. 06/30/13   Linwood Dibbles, MD  metroNIDAZOLE (FLAGYL) 500 MG tablet Take 1 tablet (500 mg total) by mouth 2 (two) times daily. 10/26/16   Mackuen, Courteney Lyn, MD  mupirocin cream (BACTROBAN) 2 % Apply 1 application topically 2 (two) times daily. 12/04/17   Taisei Bonnette, MD  Prenatal Vit-Fe Fumarate-FA (PRENATAL MULTIVITAMIN) TABS tablet Take 1 tablet by mouth daily at 12 noon. "A prenatal vitamin", not sure what/which    [provider]    Family History History reviewed. No pertinent family history.  Social History Social History   Tobacco Use  . Smoking status: Never Smoker  . Smokeless tobacco: Never Used  Substance Use Topics  . Alcohol use: Yes    Comment: socially  . Drug use: No     Allergies   Patient has no known allergies.   Review of Systems Review of Systems  Constitutional: Negative for fever  and malaise/fatigue.  Cardiovascular: Negative for palpitations, orthopnea, syncope and near-syncope.  Gastrointestinal: Negative for abdominal pain and anorexia.  Musculoskeletal: Negative for neck pain and neck stiffness.  Skin: Positive for rash. Negative for wound.  Neurological: Positive for headaches. Negative for light-headedness.  Hematological: Negative for adenopathy. Does not bruise/bleed easily.  All other systems reviewed and are negative.    Physical Exam Updated Vital Signs BP 120/71 (BP Location: Left Arm)   Pulse 73   Temp 98.1 F (36.7 C) (Oral)   Resp 18   Ht 5\' 2"  (1.575 m)   Wt 70.3 kg (155 lb)   LMP 11/27/2017   SpO2 100%   BMI 28.35 kg/m   Physical Exam  Constitutional: She is oriented to person, place, and time. She appears well-developed and well-nourished. No distress.  HENT:  Head:  Normocephalic.    Right Ear: External ear normal.  Left Ear: External ear normal.  Mouth/Throat: Oropharynx is clear and moist. No oropharyngeal exudate.  Eyes: Pupils are equal, round, and reactive to light. Conjunctivae and EOM are normal.  Neck: Normal range of motion. Neck supple.  Cardiovascular: Normal rate, regular rhythm, normal heart sounds and intact distal pulses.  Pulmonary/Chest: Effort normal and breath sounds normal. No stridor.  Abdominal: Soft. Bowel sounds are normal. There is no tenderness.  Musculoskeletal: Normal range of motion.  Lymphadenopathy:    She has no cervical adenopathy.  Neurological: She is alert and oriented to person, place, and time. She displays normal reflexes.  Skin: Skin is warm and dry. Capillary refill takes less than 2 seconds.  Psychiatric: She has a normal mood and affect.     ED Treatments / Results  Labs (all labs ordered are listed, but only abnormal results are displayed)  Results for orders placed or performed during the hospital encounter of 12/04/17  Pregnancy, urine  Result Value Ref Range   Preg Test, Ur NEGATIVE NEGATIVE   No results found.  EKG None  Radiology No results found.  Procedures Procedures (including critical care time)  Medications Ordered in ED Medications  ketorolac (TORADOL) injection 30 mg (30 mg Intramuscular Given 12/04/17 0149)      Final Clinical Impressions(s) / ED Diagnoses   Final diagnoses:  Impetigo   Headache is almost certainly caused by the traction of the weave and there is impetigo of the skin covering that area and forehead at hair line.  Will cover with PO and topical antibiotics.  No weaves no scratching no head coverings.    Return for weakness, numbness, changes in vision or speech, fevers >100.4 unrelieved by medication, shortness of breath, intractable vomiting, or diarrhea, abdominal pain, Inability to tolerate liquids or food, cough, altered mental status or any  concerns. No signs of systemic illness or infection. The patient is nontoxic-appearing on exam and vital signs are within normal limits.   I have reviewed the triage vital signs and the nursing notes. Pertinent labs &imaging results that were available during my care of the patient were reviewed by me and considered in my medical decision making (see chart for details).  After history, exam, and medical workup I feel the patient has been appropriately medically screened and is safe for discharge home. Pertinent diagnoses were discussed with the patient. Patient was given return precautions.   ED Discharge Orders        Ordered    mupirocin cream (BACTROBAN) 2 %  2 times daily     12/04/17 0230    cephALEXin (KEFLEX)  500 MG capsule  4 times daily     12/04/17 0230       Berdene Askari, MD 12/04/17 9629

## 2018-01-28 ENCOUNTER — Other Ambulatory Visit: Payer: Self-pay

## 2018-01-28 ENCOUNTER — Encounter (HOSPITAL_BASED_OUTPATIENT_CLINIC_OR_DEPARTMENT_OTHER): Payer: Self-pay | Admitting: *Deleted

## 2018-01-28 ENCOUNTER — Emergency Department (HOSPITAL_BASED_OUTPATIENT_CLINIC_OR_DEPARTMENT_OTHER)
Admission: EM | Admit: 2018-01-28 | Discharge: 2018-01-28 | Disposition: A | Discharge status: Home / Self Care | Payer: Medicaid Other | Attending: Emergency Medicine | Admitting: Emergency Medicine

## 2018-01-28 DIAGNOSIS — T7840XA Allergy, unspecified, initial encounter: Secondary | ICD-10-CM

## 2018-01-28 DIAGNOSIS — T783XXA Angioneurotic edema, initial encounter: Secondary | ICD-10-CM

## 2018-01-28 DIAGNOSIS — Z79899 Other long term (current) drug therapy: Secondary | ICD-10-CM | POA: Insufficient documentation

## 2018-01-28 DIAGNOSIS — T781XXA Other adverse food reactions, not elsewhere classified, initial encounter: Secondary | ICD-10-CM | POA: Insufficient documentation

## 2018-01-28 MED ORDER — PREDNISONE 20 MG PO TABS: ORAL_TABLET | ORAL | 0 refills | Status: DC

## 2018-01-28 MED ORDER — FAMOTIDINE 20 MG PO TABS
20.0000 mg | ORAL_TABLET | Freq: Once | ORAL | Status: AC
  Administered 2018-01-28: 20 mg via ORAL
  Filled 2018-01-28: qty 1

## 2018-01-28 MED ORDER — PREDNISONE 50 MG PO TABS
60.0000 mg | ORAL_TABLET | Freq: Once | ORAL | Status: AC
  Administered 2018-01-28: 11:00:00 60 mg via ORAL
  Filled 2018-01-28: qty 1

## 2018-01-28 MED ORDER — RANITIDINE HCL 150 MG PO TABS: 150.0000 mg | ORAL_TABLET | Freq: Two times a day (BID) | ORAL | 0 refills | Status: AC

## 2018-01-28 MED ORDER — DIPHENHYDRAMINE HCL 25 MG PO CAPS
25.0000 mg | ORAL_CAPSULE | Freq: Once | ORAL | Status: AC
  Administered 2018-01-28: 25 mg via ORAL
  Filled 2018-01-28: qty 1

## 2018-01-28 MED ORDER — EPINEPHRINE 0.3 MG/0.3ML IJ SOAJ: 0.3000 mg | Freq: Once | INTRAMUSCULAR | 2 refills | Status: AC

## 2018-01-28 MED ORDER — DIPHENHYDRAMINE HCL 25 MG PO TABS: 25.0000 mg | ORAL_TABLET | Freq: Four times a day (QID) | ORAL | 0 refills | Status: AC | PRN

## 2018-01-28 NOTE — Discharge Instructions (Signed)
Take Benadryl and zantac as directed, taking your next dose of zantac tonight with dinner. Take prednisone as directed until completed, starting tomorrow since you received today's dose here. If you develop signs or symptoms of severe allergic reaction (anaphylactic reaction, as outlined below) then use the EpiPen and call 911 immediately in order to get emergent medical care.  Continue your usual home medications. Get plenty of rest and drink plenty of fluids. Avoid any known triggers. Please followup with your primary doctor in 3-5 days for recheck of symptoms and for discussion of your diagnoses and further evaluation after today's visit; if you do not have a primary care doctor use the resource guide provided to find one. Return to the ER for changes or worsening symptoms

## 2018-01-28 NOTE — ED Triage Notes (Signed)
Pt reports waking this am and feeling fine, she noticed when she looked in the mirror that her upper lip was swollen, she put ice on it and some swelling resolved, upper lip remains swollen. Pt states she ate shrimp off the grill last night, but has never had a reaction to shrimp before. Denies any tongue or throat swelling, sob or any other c/o.

## 2018-01-28 NOTE — ED Notes (Signed)
Pt cont await md eval. 

## 2018-01-28 NOTE — ED Provider Notes (Signed)
[facility] EMERGENCY DEPARTMENT Provider Note   CSN: [id number] Arrival date & time: 01/28/18  0831     History   Chief Complaint Chief Complaint  Patient presents with  . Oral Swelling    HPI Alexandra Sparks is a 32 y.o. female with a PMHx of ovarian cysts, who presents to the ED with complaints of upper lip swelling that began this morning when she woke up.  She used ice which helped some, but she states that there is some slight residual swelling in her lip.  No known aggravating factors.  She denies any recent changes in medications, cosmetics, lotions, detergents, soaps, animal or plant contacts, or any other exposure to unknown allergen.  She had a hamburger for lunch yesterday, then had shrimp that was made by someone else last night, and then before bed she had a bowl of cereal, but none of this was new or different.  She denies any rashes, tongue or throat swelling, difficulty swallowing or breathing, wheezing, CP, SOB, abdominal pain, nausea, vomiting, diarrhea, or any other complaints at this time.  The history is provided by the patient and medical records. No language interpreter was used.    Past Medical History:  Diagnosis Date  . Ovarian cyst     There are no active problems to display for this patient.   Past Surgical History:  Procedure Laterality Date  . FOOT SURGERY Bilateral      OB History    Gravida  1   Para  0   Term      Preterm      AB      Living        SAB      TAB      Ectopic      Multiple      Live Births               Home Medications    Prior to Admission medications   Medication Sig Start Date End Date Taking? Authorizing Provider  cephALEXin (KEFLEX) 500 MG capsule Take 1 capsule (500 mg total) by mouth 2 (two) times daily. 07/13/13   Horton, Mayer Masker, MD  cephALEXin (KEFLEX) 500 MG capsule Take 1 capsule (500 mg total) by mouth 4 (four) times daily. 12/04/17   Palumbo, April, MD    cetirizine-pseudoephedrine (ZYRTEC-D) 5-120 MG tablet Take 1 tablet by mouth 2 (two) times daily. 10/27/17   Law, Waylan Boga, PA-C  ibuprofen (ADVIL,MOTRIN) 600 MG tablet Take 1 tablet (600 mg total) by mouth every 6 (six) hours as needed. 07/28/15   Lyndal Pulley, MD  metoCLOPramide (REGLAN) 10 MG tablet Take 1 tablet (10 mg total) by mouth every 6 (six) hours. 06/30/13   Linwood Dibbles, MD  metroNIDAZOLE (FLAGYL) 500 MG tablet Take 1 tablet (500 mg total) by mouth 2 (two) times daily. 10/26/16   Mackuen, Courteney Lyn, MD  mupirocin cream (BACTROBAN) 2 % Apply 1 application topically 2 (two) times daily. 12/04/17   Palumbo, April, MD  Prenatal Vit-Fe Fumarate-FA (PRENATAL MULTIVITAMIN) TABS tablet Take 1 tablet by mouth daily at 12 noon. "A prenatal vitamin", not sure what/which    [provider]    Family History History reviewed. No pertinent family history.  Social History Social History   Tobacco Use  . Smoking status: Never Smoker  . Smokeless tobacco: Never Used  Substance Use Topics  . Alcohol use: Yes    Comment: socially  . Drug use: No  Allergies   Patient has no known allergies.   Review of Systems Review of Systems  HENT: Positive for facial swelling. Negative for sore throat and trouble swallowing.   Respiratory: Negative for cough, shortness of breath and wheezing.   Cardiovascular: Negative for chest pain.  Gastrointestinal: Negative for abdominal pain, diarrhea, nausea and vomiting.  Skin: Negative for rash.  Allergic/Immunologic: Negative for immunocompromised state.     Physical Exam Updated Vital Signs BP 108/79 (BP Location: Right Arm)   Pulse 67   Temp 97.7 F (36.5 C) (Oral)   Resp 19   Ht  (1.575 m)   Wt 65.8 kg (145 lb)   LMP 01/20/2018   SpO2 100%   BMI 26.52 kg/m   Physical Exam  Constitutional: She is oriented to person, place, and time. Vital signs are normal. She appears well-developed and well-nourished.  Non-toxic  appearance. No distress.  Afebrile, nontoxic, NAD  HENT:  Head: Normocephalic and atraumatic.  Mouth/Throat: Uvula is midline, oropharynx is clear and moist and mucous membranes are normal. No trismus in the jaw. No uvula swelling. Tonsils are 0 on the right. Tonsils are 0 on the left. No tonsillar exudate.  Very minimal upper lip swelling, no erythema or induration. No stridor or abnormal phonation. airway patent. Oropharynx clear and moist, without uvular swelling or deviation, no trismus or drooling, no tonsillar swelling or erythema, no exudates.    Eyes: Conjunctivae and EOM are normal. Right eye exhibits no discharge. Left eye exhibits no discharge.  Neck: Normal range of motion. Neck supple.  Cardiovascular: Normal rate and intact distal pulses.  Pulmonary/Chest: Effort normal and breath sounds normal. No respiratory distress. She has no decreased breath sounds. She has no wheezes. She has no rhonchi. She has no rales.  CTAB in all lung fields, no w/r/r, no hypoxia or increased WOB, speaking in full sentences, SpO2 100% on RA   Abdominal: Normal appearance. She exhibits no distension.  Musculoskeletal: Normal range of motion.  Neurological: She is alert and oriented to person, place, and time. She has normal strength. No sensory deficit.  Skin: Skin is warm, dry and intact. No rash noted.  Psychiatric: She has a normal mood and affect. Her behavior is normal.  Nursing note and vitals reviewed.    ED Treatments / Results  Labs (all labs ordered are listed, but only abnormal results are displayed) Labs Reviewed - No data to display  EKG None  Radiology No results found.  Procedures Procedures (including critical care time)  Medications Ordered in ED Medications  diphenhydrAMINE (BENADRYL) capsule 25 mg (25 mg Oral Given 01/28/18 1057)  predniSONE (DELTASONE) tablet 60 mg (60 mg Oral Given 01/28/18 1057)  famotidine (PEPCID) tablet 20 mg (20 mg Oral Given 01/28/18 1057)      Initial Impression / Assessment and Plan / ED Course  I have reviewed the triage vital signs and the nursing notes.  Pertinent labs & imaging results that were available during my care of the patient were reviewed by me and considered in my medical decision making (see chart for details).     32 y.o. female here with upper lip swelling that began this morning when she woke up. Ate shrimp and then cereal last night, has had both before. On exam, very minimal upper lip swelling, no induration or warmth, no rashes. Airway patent, no stridor, no wheezing, clear lung exam. Overall reassuring exam. Will give benadryl/pepcid/prednisone and monitor. Doubt need for labs/imaging.   1:16 PM Pt  feeling better and without any worsening/progressing symptoms, states symptoms are actually nearly resolved; lip looks fairly unremarkable now, doesn't look swollen at all. I feel they are safe to go home at this time. Will send home with benadryl/zantac/prednisone, will also give EpiPen and instructed on guidelines for use of this. Advised avoidance of allergens. F/up with PCP in 3-5 days for recheck.  I explained the diagnosis and have given explicit precautions to return to the ER including for any other new or worsening symptoms. The patient understands and accepts the medical plan as it's been dictated and I have answered their questions. Discharge instructions concerning home care and prescriptions have been given. The patient is STABLE and is discharged to home in good condition.    Final Clinical Impressions(s) / ED Diagnoses   Final diagnoses:  Angioedema of lips, initial encounter  Allergic reaction, initial encounter    ED Discharge Orders        Ordered    diphenhydrAMINE (BENADRYL) 25 MG tablet  Every 6 hours PRN     01/28/18 1312    ranitidine (ZANTAC) 150 MG tablet  2 times daily     01/28/18 1312    predniSONE (DELTASONE) 20 MG tablet     01/28/18 1312    EPINEPHrine 0.3 mg/0.3 mL IJ  SOAJ injection   Once     01/28/18 [address] [place], [place] 01/28/18 1316    Arby Barrette, MD 01/31/18 1745

## 2019-04-25 ENCOUNTER — Encounter (HOSPITAL_BASED_OUTPATIENT_CLINIC_OR_DEPARTMENT_OTHER): Payer: Self-pay | Admitting: *Deleted

## 2019-04-25 ENCOUNTER — Emergency Department (HOSPITAL_BASED_OUTPATIENT_CLINIC_OR_DEPARTMENT_OTHER)
Admission: EM | Admit: 2019-04-25 | Discharge: 2019-04-26 | Disposition: A | Discharge status: Home / Self Care | Payer: Medicaid Other | Attending: Emergency Medicine | Admitting: Emergency Medicine

## 2019-04-25 ENCOUNTER — Other Ambulatory Visit: Payer: Self-pay

## 2019-04-25 DIAGNOSIS — R112 Nausea with vomiting, unspecified: Secondary | ICD-10-CM | POA: Insufficient documentation

## 2019-04-25 DIAGNOSIS — R1084 Generalized abdominal pain: Secondary | ICD-10-CM | POA: Insufficient documentation

## 2019-04-25 DIAGNOSIS — Z79899 Other long term (current) drug therapy: Secondary | ICD-10-CM | POA: Insufficient documentation

## 2019-04-25 LAB — PREGNANCY, URINE: Preg Test, Ur: NEGATIVE

## 2019-04-25 LAB — URINALYSIS, ROUTINE W REFLEX MICROSCOPIC
Bilirubin Urine: NEGATIVE
Glucose, UA: NEGATIVE mg/dL
Ketones, ur: 15 mg/dL — AB
Leukocytes,Ua: NEGATIVE
Nitrite: NEGATIVE
Protein, ur: 30 mg/dL — AB
Specific Gravity, Urine: 1.025 (ref 1.005–1.030)
pH: 6 (ref 5.0–8.0)

## 2019-04-25 LAB — CBC WITH DIFFERENTIAL/PLATELET
Abs Immature Granulocytes: 0.01 10*3/uL (ref 0.00–0.07)
Basophils Absolute: 0 10*3/uL (ref 0.0–0.1)
Basophils Relative: 1 %
Eosinophils Absolute: 0 10*3/uL (ref 0.0–0.5)
Eosinophils Relative: 0 %
HCT: 40.6 % (ref 36.0–46.0)
Hemoglobin: 12.6 g/dL (ref 12.0–15.0)
Immature Granulocytes: 0 %
Lymphocytes Relative: 48 %
Lymphs Abs: 1.6 10*3/uL (ref 0.7–4.0)
MCH: 23 pg — ABNORMAL LOW (ref 26.0–34.0)
MCHC: 31 g/dL (ref 30.0–36.0)
MCV: 74.2 fL — ABNORMAL LOW (ref 80.0–100.0)
Monocytes Absolute: 0.5 10*3/uL (ref 0.1–1.0)
Monocytes Relative: 16 %
Neutro Abs: 1.2 10*3/uL — ABNORMAL LOW (ref 1.7–7.7)
Neutrophils Relative %: 35 %
Platelets: 207 10*3/uL (ref 150–400)
RBC: 5.47 MIL/uL — ABNORMAL HIGH (ref 3.87–5.11)
RDW: 14 % (ref 11.5–15.5)
WBC: 3.4 10*3/uL — ABNORMAL LOW (ref 4.0–10.5)
nRBC: 0 % (ref 0.0–0.2)

## 2019-04-25 LAB — COMPREHENSIVE METABOLIC PANEL
ALT: 25 U/L (ref 0–44)
AST: 23 U/L (ref 15–41)
Albumin: 4.3 g/dL (ref 3.5–5.0)
Alkaline Phosphatase: 57 U/L (ref 38–126)
Anion gap: 10 (ref 5–15)
BUN: 13 mg/dL (ref 6–20)
CO2: 23 mmol/L (ref 22–32)
Calcium: 8.9 mg/dL (ref 8.9–10.3)
Chloride: 105 mmol/L (ref 98–111)
Creatinine, Ser: 0.77 mg/dL (ref 0.44–1.00)
GFR calc Af Amer: >60 mL/min (ref >60)
GFR calc non Af Amer: >60 mL/min (ref >60)
Glucose, Bld: 89 mg/dL (ref 70–99)
Potassium: 3.3 mmol/L — ABNORMAL LOW (ref 3.5–5.1)
Sodium: 138 mmol/L (ref 135–145)
Total Bilirubin: 0.8 mg/dL (ref 0.3–1.2)
Total Protein: 7.4 g/dL (ref 6.5–8.1)

## 2019-04-25 LAB — LIPASE, BLOOD: Lipase: 33 U/L (ref 11–51)

## 2019-04-25 LAB — URINALYSIS, MICROSCOPIC (REFLEX)

## 2019-04-25 MED ORDER — SODIUM CHLORIDE 0.9 % IV BOLUS
1000.0000 mL | Freq: Once | INTRAVENOUS | Status: AC
  Administered 2019-04-26: 1000 mL via INTRAVENOUS

## 2019-04-25 MED ORDER — DICYCLOMINE HCL 10 MG PO CAPS
10.0000 mg | ORAL_CAPSULE | Freq: Once | ORAL | Status: AC
  Administered 2019-04-26: 10 mg via ORAL
  Filled 2019-04-25: qty 1

## 2019-04-25 MED ORDER — ONDANSETRON HCL 4 MG/2ML IJ SOLN
4.0000 mg | Freq: Once | INTRAMUSCULAR | Status: AC
  Administered 2019-04-26: 4 mg via INTRAVENOUS
  Filled 2019-04-25: qty 2

## 2019-04-25 NOTE — ED Triage Notes (Signed)
Pt c/ diffuse abd pain and nausea  x 2 days LMP 8/1

## 2019-04-26 MED ORDER — ONDANSETRON 4 MG PO TBDP: 4.0000 mg | ORAL_TABLET | Freq: Three times a day (TID) | ORAL | 0 refills | Status: DC | PRN

## 2019-04-26 MED ORDER — DICYCLOMINE HCL 20 MG PO TABS: 20.0000 mg | ORAL_TABLET | Freq: Two times a day (BID) | ORAL | 0 refills | Status: DC

## 2019-04-26 NOTE — Discharge Instructions (Addendum)
You were seen today for abdominal pain.  The cause of your pain at this time is unknown.  Your work-up is largely reassuring.  Take medications as prescribed.  Make sure that you are staying hydrated.

## 2019-04-26 NOTE — ED Notes (Signed)
Gave patient gingerale for po challenge.  

## 2019-04-26 NOTE — ED Provider Notes (Signed)
[facility] EMERGENCY DEPARTMENT Provider Note   CSN: [id number] Arrival date & time: 04/25/19  2052     History   Chief Complaint Chief Complaint  Patient presents with  . Abdominal Pain    HPI Alexandra Sparks is a 33 y.o. female.     HPI  This is a 33 year old female with no significant past medical history who presents with abdominal pain, nausea, and vomiting.  Patient reports 2-day history of diffuse upper abdominal pain that is crampy in nature.  She denies any back pain or radiation of pain.  Currently she rates her pain at 5 out of 10.  She has had a few episodes of nonbilious, nonbloody emesis.  She reports decreased p.o. intake.  She reports normal bowel movements.  Denies any urinary symptoms, fevers.  Denies any vaginal discharge or concerns for STDs.  No known sick contacts.  Patient reports that she took an over-the-counter medication with minimal relief.  Past Medical History:  Diagnosis Date  . Ovarian cyst     There are no active problems to display for this patient.   Past Surgical History:  Procedure Laterality Date  . FOOT SURGERY Bilateral      OB History    Gravida  1   Para  0   Term      Preterm      AB      Living        SAB      TAB      Ectopic      Multiple      Live Births               Home Medications    Prior to Admission medications   Medication Sig Start Date End Date Taking? Authorizing Provider  nitrofurantoin, macrocrystal-monohydrate, (MACROBID) 100 MG capsule Take 100 mg by mouth 2 (two) times daily.   Yes [provider]  cephALEXin (KEFLEX) 500 MG capsule Take 1 capsule (500 mg total) by mouth 2 (two) times daily. 07/13/13   Ottis Vacha, Barbette Hair, MD  cephALEXin (KEFLEX) 500 MG capsule Take 1 capsule (500 mg total) by mouth 4 (four) times daily. 12/04/17   Palumbo, April, MD  cetirizine-pseudoephedrine (ZYRTEC-D) 5-120 MG tablet Take 1 tablet by mouth 2 (two) times daily. 10/27/17   Law,  Bea Graff, PA-C  dicyclomine (BENTYL) 20 MG tablet Take 1 tablet (20 mg total) by mouth 2 (two) times daily. 04/26/19   Colie Josten, Barbette Hair, MD  diphenhydrAMINE (BENADRYL) 25 MG tablet Take 1 tablet (25 mg total) by mouth every 6 (six) hours as needed for allergies. 01/28/18   Street, [place], PA-C  ibuprofen (ADVIL,MOTRIN) 600 MG tablet Take 1 tablet (600 mg total) by mouth every 6 (six) hours as needed. 07/28/15   Leo Grosser, MD  metoCLOPramide (REGLAN) 10 MG tablet Take 1 tablet (10 mg total) by mouth every 6 (six) hours. 06/30/13   Dorie Rank, MD  metroNIDAZOLE (FLAGYL) 500 MG tablet Take 1 tablet (500 mg total) by mouth 2 (two) times daily. 10/26/16   Mackuen, Courteney Lyn, MD  mupirocin cream (BACTROBAN) 2 % Apply 1 application topically 2 (two) times daily. 12/04/17   Palumbo, April, MD  ondansetron (ZOFRAN ODT) 4 MG disintegrating tablet Take 1 tablet (4 mg total) by mouth every 8 (eight) hours as needed. 04/26/19   Saketh Daubert, Barbette Hair, MD  predniSONE (DELTASONE) 20 MG tablet 3 tabs po daily x 4 days STARTING TOMORROW 01/29/18 01/28/18   Street,  Mercedes, PA-C  Prenatal Vit-Fe Fumarate-FA (PRENATAL MULTIVITAMIN) TABS tablet Take 1 tablet by mouth daily at 12 noon. "A prenatal vitamin", not sure what/which    [provider]  ranitidine (ZANTAC) 150 MG tablet Take 1 tablet (150 mg total) by mouth 2 (two) times daily for 5 days. 01/28/18 02/02/18  Street, [place], PA-C    Family History No family history on file.  Social History Social History   Tobacco Use  . Smoking status: Never Smoker  . Smokeless tobacco: Never Used  Substance Use Topics  . Alcohol use: Yes    Comment: socially  . Drug use: No     Allergies   Patient has no known allergies.   Review of Systems Review of Systems  Constitutional: Negative for fever.  Respiratory: Negative for shortness of breath.   Cardiovascular: Negative for chest pain.  Gastrointestinal: Positive for abdominal pain, nausea and  vomiting. Negative for constipation and diarrhea.  Genitourinary: Negative for dysuria and hematuria.  Neurological: Negative for headaches.  All other systems reviewed and are negative.    Physical Exam Updated Vital Signs BP 112/73 (BP Location: Right Arm)   Pulse 62   Temp 99.2 F (37.3 C) (Oral)   Resp 16   Ht 1.575 m (5\' 2" )   Wt 61.2 kg   LMP 04/09/2019   SpO2 100%   BMI 24.69 kg/m   Physical Exam Vitals signs and nursing note reviewed.  Constitutional:      Appearance: She is well-developed. She is not ill-appearing.  HENT:     Head: Normocephalic and atraumatic.  Eyes:     Pupils: Pupils are equal, round, and reactive to light.  Neck:     Musculoskeletal: Neck supple.  Cardiovascular:     Rate and Rhythm: Normal rate and regular rhythm.     Heart sounds: Normal heart sounds.  Pulmonary:     Effort: Pulmonary effort is normal. No respiratory distress.     Breath sounds: No wheezing.  Abdominal:     General: Bowel sounds are normal.     Palpations: Abdomen is soft.     Tenderness: There is no abdominal tenderness. There is no guarding or rebound.  Skin:    General: Skin is warm and dry.  Neurological:     Mental Status: She is alert and oriented to person, place, and time.  Psychiatric:        Mood and Affect: Mood normal.      ED Treatments / Results  Labs (all labs ordered are listed, but only abnormal results are displayed) Labs Reviewed  URINALYSIS, ROUTINE W REFLEX MICROSCOPIC - Abnormal; Notable for the following components:      Result Value   APPearance CLOUDY (*)    Hgb urine dipstick MODERATE (*)    Ketones, ur 15 (*)    Protein, ur 30 (*)    All other components within normal limits  COMPREHENSIVE METABOLIC PANEL - Abnormal; Notable for the following components:   Potassium 3.3 (*)    All other components within normal limits  CBC WITH DIFFERENTIAL/PLATELET - Abnormal; Notable for the following components:   WBC 3.4 (*)    RBC 5.47  (*)    MCV 74.2 (*)    MCH 23.0 (*)    Neutro Abs 1.2 (*)    All other components within normal limits  URINALYSIS, MICROSCOPIC (REFLEX) - Abnormal; Notable for the following components:   Bacteria, UA FEW (*)    All other components within normal  limits  PREGNANCY, URINE  LIPASE, BLOOD    EKG None  Radiology No results found.  Procedures Procedures (including critical care time)  Medications Ordered in ED Medications  sodium chloride 0.9 % bolus 1,000 mL (1,000 mLs Intravenous New Bag/Given 04/26/19 0006)  dicyclomine (BENTYL) capsule 10 mg (10 mg Oral Given 04/26/19 0004)  ondansetron (ZOFRAN) injection 4 mg (4 mg Intravenous Given 04/26/19 0006)     Initial Impression / Assessment and Plan / ED Course  I have reviewed the triage vital signs and the nursing notes.  Pertinent labs & imaging results that were available during my care of the patient were reviewed by me and considered in my medical decision making (see chart for details).         Patient presents with abdominal pain.  She is overall nontoxic-appearing and vital signs are reassuring.  Abdominal exam is benign without significant tenderness or peritonitis.  Lab work reviewed and is largely reassuring.  Normal LFTs normal lipase.  Given location of pain and symptoms, suspect gastritis versus gastroenteritis.  Lower suspicion for pancreatitis or cholecystitis given negative work-up.  On recheck, patient feels much improved after fluids, Zofran, and Bentyl.  She is able to orally hydrate.  She was initially tachycardic but that improved with fluids.  Recommend supportive measures at home.  After history, exam, and medical workup I feel the patient has been appropriately medically screened and is safe for discharge home. Pertinent diagnoses were discussed with the patient. Patient was given return precautions.    Final Clinical Impressions(s) / ED Diagnoses   Final diagnoses:  Generalized abdominal pain    ED  Discharge Orders         Ordered    dicyclomine (BENTYL) 20 MG tablet  2 times daily     04/26/19 0050    ondansetron (ZOFRAN ODT) 4 MG disintegrating tablet  Every 8 hours PRN     04/26/19 0050           Shon BatonHorton, Joline Encalada F, MD 04/26/19 [phone]

## 2020-09-04 DIAGNOSIS — O98311 Other infections with a predominantly sexual mode of transmission complicating pregnancy, first trimester: Secondary | ICD-10-CM | POA: Insufficient documentation

## 2020-09-04 DIAGNOSIS — Z3A01 Less than 8 weeks gestation of pregnancy: Secondary | ICD-10-CM | POA: Insufficient documentation

## 2020-09-04 DIAGNOSIS — O2311 Infections of bladder in pregnancy, first trimester: Secondary | ICD-10-CM | POA: Insufficient documentation

## 2020-09-04 DIAGNOSIS — O4691 Antepartum hemorrhage, unspecified, first trimester: Secondary | ICD-10-CM | POA: Insufficient documentation

## 2020-09-04 DIAGNOSIS — B9689 Other specified bacterial agents as the cause of diseases classified elsewhere: Secondary | ICD-10-CM | POA: Insufficient documentation

## 2020-09-04 DIAGNOSIS — A599 Trichomoniasis, unspecified: Secondary | ICD-10-CM | POA: Insufficient documentation

## 2020-09-05 ENCOUNTER — Encounter (HOSPITAL_BASED_OUTPATIENT_CLINIC_OR_DEPARTMENT_OTHER): Payer: Self-pay | Admitting: Emergency Medicine

## 2020-09-05 ENCOUNTER — Other Ambulatory Visit: Payer: Self-pay

## 2020-09-05 ENCOUNTER — Inpatient Hospital Stay (HOSPITAL_COMMUNITY): Payer: Medicaid Other

## 2020-09-05 ENCOUNTER — Inpatient Hospital Stay (HOSPITAL_BASED_OUTPATIENT_CLINIC_OR_DEPARTMENT_OTHER)
Admission: EM | Admit: 2020-09-05 | Discharge: 2020-09-05 | Disposition: A | Discharge status: Home / Self Care | Payer: Medicaid Other | Attending: Obstetrics & Gynecology | Admitting: Obstetrics & Gynecology

## 2020-09-05 DIAGNOSIS — B9689 Other specified bacterial agents as the cause of diseases classified elsewhere: Secondary | ICD-10-CM | POA: Diagnosis not present

## 2020-09-05 DIAGNOSIS — O468X1 Other antepartum hemorrhage, first trimester: Secondary | ICD-10-CM

## 2020-09-05 DIAGNOSIS — A599 Trichomoniasis, unspecified: Secondary | ICD-10-CM | POA: Diagnosis not present

## 2020-09-05 DIAGNOSIS — O4690 Antepartum hemorrhage, unspecified, unspecified trimester: Secondary | ICD-10-CM

## 2020-09-05 DIAGNOSIS — O418X10 Other specified disorders of amniotic fluid and membranes, first trimester, not applicable or unspecified: Secondary | ICD-10-CM

## 2020-09-05 DIAGNOSIS — O208 Other hemorrhage in early pregnancy: Secondary | ICD-10-CM | POA: Diagnosis present

## 2020-09-05 DIAGNOSIS — O2311 Infections of bladder in pregnancy, first trimester: Secondary | ICD-10-CM | POA: Diagnosis not present

## 2020-09-05 DIAGNOSIS — N3001 Acute cystitis with hematuria: Secondary | ICD-10-CM

## 2020-09-05 DIAGNOSIS — Z3A01 Less than 8 weeks gestation of pregnancy: Secondary | ICD-10-CM | POA: Diagnosis not present

## 2020-09-05 DIAGNOSIS — O4691 Antepartum hemorrhage, unspecified, first trimester: Secondary | ICD-10-CM | POA: Diagnosis not present

## 2020-09-05 DIAGNOSIS — Z3491 Encounter for supervision of normal pregnancy, unspecified, first trimester: Secondary | ICD-10-CM

## 2020-09-05 DIAGNOSIS — O98311 Other infections with a predominantly sexual mode of transmission complicating pregnancy, first trimester: Secondary | ICD-10-CM | POA: Diagnosis not present

## 2020-09-05 LAB — CBC WITH DIFFERENTIAL/PLATELET
Abs Immature Granulocytes: 0 10*3/uL (ref 0.00–0.07)
Basophils Absolute: 0 10*3/uL (ref 0.0–0.1)
Basophils Relative: 1 %
Eosinophils Absolute: 0 10*3/uL (ref 0.0–0.5)
Eosinophils Relative: 0 %
HCT: 34.7 % — ABNORMAL LOW (ref 36.0–46.0)
Hemoglobin: 11.2 g/dL — ABNORMAL LOW (ref 12.0–15.0)
Immature Granulocytes: 0 %
Lymphocytes Relative: 16 %
Lymphs Abs: 0.6 10*3/uL — ABNORMAL LOW (ref 0.7–4.0)
MCH: 23.5 pg — ABNORMAL LOW (ref 26.0–34.0)
MCHC: 32.3 g/dL (ref 30.0–36.0)
MCV: 72.7 fL — ABNORMAL LOW (ref 80.0–100.0)
Monocytes Absolute: 0.4 10*3/uL (ref 0.1–1.0)
Monocytes Relative: 12 %
Neutro Abs: 2.7 10*3/uL (ref 1.7–7.7)
Neutrophils Relative %: 71 %
Platelets: 275 10*3/uL (ref 150–400)
RBC: 4.77 MIL/uL (ref 3.87–5.11)
RDW: 14.1 % (ref 11.5–15.5)
WBC: 3.7 10*3/uL — ABNORMAL LOW (ref 4.0–10.5)
nRBC: 0 % (ref 0.0–0.2)

## 2020-09-05 LAB — BASIC METABOLIC PANEL
Anion gap: 10 (ref 5–15)
BUN: 6 mg/dL (ref 6–20)
CO2: 19 mmol/L — ABNORMAL LOW (ref 22–32)
Calcium: 9.2 mg/dL (ref 8.9–10.3)
Chloride: 105 mmol/L (ref 98–111)
Creatinine, Ser: 0.5 mg/dL (ref 0.44–1.00)
GFR, Estimated: >60 mL/min (ref >60)
Glucose, Bld: 93 mg/dL (ref 70–99)
Potassium: 3.3 mmol/L — ABNORMAL LOW (ref 3.5–5.1)
Sodium: 134 mmol/L — ABNORMAL LOW (ref 135–145)

## 2020-09-05 LAB — URINALYSIS, MICROSCOPIC (REFLEX)

## 2020-09-05 LAB — URINALYSIS, ROUTINE W REFLEX MICROSCOPIC
Glucose, UA: NEGATIVE mg/dL
Ketones, ur: >=80 mg/dL — AB
Nitrite: POSITIVE — AB
Protein, ur: NEGATIVE mg/dL
Specific Gravity, Urine: 1.03 (ref 1.005–1.030)
pH: 6 (ref 5.0–8.0)

## 2020-09-05 LAB — GC/CHLAMYDIA PROBE AMP (~~LOC~~) NOT AT ARMC
Chlamydia: NEGATIVE
Comment: NEGATIVE
Comment: NORMAL
Neisseria Gonorrhea: NEGATIVE

## 2020-09-05 LAB — HCG, QUANTITATIVE, PREGNANCY: hCG, Beta Chain, Quant, S: 140876 m[IU]/mL — ABNORMAL HIGH (ref <5)

## 2020-09-05 LAB — WET PREP, GENITAL
Clue Cells Wet Prep HPF POC: NONE SEEN
Sperm: NONE SEEN
Yeast Wet Prep HPF POC: NONE SEEN

## 2020-09-05 MED ORDER — METRONIDAZOLE 500 MG PO TABS
2000.0000 mg | ORAL_TABLET | Freq: Once | ORAL | Status: AC
  Administered 2020-09-05: 06:00:00 2000 mg via ORAL
  Filled 2020-09-05: qty 4

## 2020-09-05 NOTE — ED Triage Notes (Signed)
Pt reports being [redacted]weeks pregnant, woke up with vaginal bleeding and cramps, says she "passed a clot"; pt reports hx of miscarriage; pt says bleeding has slowed, but reports abdominal cramps; pt reports nausea

## 2020-09-05 NOTE — ED Notes (Signed)
Pt going to MAU POV.

## 2020-09-05 NOTE — MAU Provider Note (Signed)
S Ms. Alexandra Sparks is a 34 y.o. G2P0010 patient who presents to MAU today as a transfer from [facility] with complaint of vaginal bleeding and abdominal pain. Patient reports that she started having vaginal bleeding and abdominal pain today - passed one large clot in the bathroom but denies seeing any tissue. She reports lower abdominal pain that is intermittent.   O BP 107/66   Pulse 72   Temp 98.2 F (36.8 C) (Oral)   Resp 16   Ht 5\' 2"  (1.575 m)   Wt 60.8 kg   SpO2 100%   BMI 24.51 kg/m  Physical Exam Vitals and nursing note reviewed.  HENT:     Head: Normocephalic.  Cardiovascular:     Rate and Rhythm: Normal rate and regular rhythm.  Pulmonary:     Effort: Pulmonary effort is normal.     Breath sounds: Normal breath sounds.  Neurological:     Mental Status: She is alert and oriented to person, place, and time.  Psychiatric:        Mood and Affect: Mood normal.        Behavior: Behavior normal.        Thought Content: Thought content normal.    Trich treatment given in MAU - 2,000mg  of Flagyl, expedited partner therapy given   reviewed:  US OB LESS THAN 14 WEEKS WITH OB TRANSVAGINAL  Result Date: 09/05/2020 CLINICAL DATA:  Vaginal bleeding EXAM: OBSTETRIC <14 WK 09/07/2020 AND TRANSVAGINAL OB US TECHNIQUE: Both transabdominal and transvaginal ultrasound examinations were performed for complete evaluation of the gestation as well as the maternal uterus, adnexal regions, and pelvic cul-de-sac. Transvaginal technique was performed to assess early pregnancy. COMPARISON:  None. FINDINGS: Intrauterine gestational sac: Present, single Yolk sac:  Present, single, normal-appearing Embryo:  Present, single Cardiac Activity: Present, regular Heart Rate: 136 bpm MSD: Appropriate given fetal size CRL:  10 mm   7 w   0 d                  [place] EDC: 04/24/2021 Subchorionic hemorrhage: A large subchorionic hemorrhage is identified comprising roughly 50% of the chorionic surface. Maternal uterus/adnexae:  The uterus is retroverted. No intrauterine masses are seen. The cervix is closed and is unremarkable. No free fluid is seen within the cul-de-sac. The left ovary is unremarkable. The right ovary contains a a cystic structure most in keeping with a cystic corpus luteum. No adnexal masses are seen. IMPRESSION: Single living intrauterine gestation with an estimated gestational age of [redacted] weeks, 0 days. Large subchorionic hemorrhage. Electronically Signed   By: 04/26/2021 MD   On: 09/05/2020 06:55     A Medical screening exam complete  Full physical examination completed at [facility]  1. Normal IUP (intrauterine pregnancy) on prenatal ultrasound, first trimester   2. Trichimoniasis   3. Acute cystitis with hematuria   4. Vaginal bleeding during pregnancy   5. Subchorionic hematoma in first trimester, single or unspecified fetus    P Discharge from MAU in stable condition Encouraged to make initial prenatal appointment  Pelvic rest  [facility] precautions and what to expect  Return to MAU as needed for reasons discussed and/or emergencies   [facility], CNM 09/05/2020 6:59 AM

## 2020-09-05 NOTE — ED Provider Notes (Signed)
Emergency Department Provider Note   I have reviewed the triage vital signs and the nursing notes.   HISTORY  Chief Complaint Abdominal Pain   HPI Alexandra Sparks is a 34 y.o. female G2P0 presents to the emergency department for evaluation of abdominal cramping with vaginal bleeding starting today. She has a prior history of miscarriage and is concerned regarding this. She is approximately [redacted] weeks pregnant by dates. She has not had confirmed IUP and is not established with an OB/GYN as of yet. She woke up feeling like she may have urinated on herself and then realize she was bleeding. She went to the bathroom and saw some passage of clots but no tissue. She has some lower abdominal cramping and continued bleeding. She notes some mild dysuria which started yesterday. No fevers, chills, or back\flank pain. She does have some associated nausea.  Past Medical History:  Diagnosis Date  . Ovarian cyst     There are no problems to display for this patient.   Past Surgical History:  Procedure Laterality Date  . FOOT SURGERY Bilateral     Allergies Nickel  No family history on file.  Social History Social History   Tobacco Use  . Smoking status: Former Games developer  . Smokeless tobacco: Never Used  Substance Use Topics  . Alcohol use: Yes    Comment: socially  . Drug use: No    Review of Systems  Constitutional: No fever/chills Eyes: No visual changes. ENT: No sore throat. Cardiovascular: Denies chest pain. Respiratory: Denies shortness of breath. Gastrointestinal: Positive lower abdominal pain.  No nausea, no vomiting.  No diarrhea.  No constipation. Positive vaginal bleeding.  Genitourinary: Positive for dysuria. Musculoskeletal: Negative for back pain. Skin: Negative for rash. Neurological: Negative for headaches, focal weakness or numbness.  10-point ROS otherwise negative.  ____________________________________________   PHYSICAL EXAM:  VITAL SIGNS: ED Triage  Vitals  Enc Vitals Group     BP 09/05/20 0015 109/62     Pulse Rate 09/05/20 0015 90     Resp 09/05/20 0015 18     Temp 09/05/20 0015 99.1 F (37.3 C)     Temp Source 09/05/20 0015 Oral     SpO2 09/05/20 0015 100 %     Weight 09/05/20 0017 134 lb (60.8 kg)     Height 09/05/20 0017 5\' 2"  (1.575 m)   Constitutional: Alert and oriented. Well appearing and in no acute distress. Eyes: Conjunctivae are normal.  Head: Atraumatic. Nose: No congestion/rhinnorhea. Mouth/Throat: Mucous membranes are moist.  Neck: No stridor.   Cardiovascular: Normal rate, regular rhythm. Good peripheral circulation. Grossly normal heart sounds.   Respiratory: Normal respiratory effort.  No retractions. Lungs CTAB. Gastrointestinal: Soft and nontender. No distention.  Genitourinary: Exam performed with nurse tech chaperone and patient's verbal consent. The cervix is visually closed with scant blood in the vaginal vault. No clots or tissue appreciated. No heavy vaginal bleeding. Musculoskeletal: No lower extremity tenderness nor edema. No gross deformities of extremities. Neurologic:  Normal speech and language. No gross focal neurologic deficits are appreciated.  Skin:  Skin is warm, dry and intact. No rash noted.  ____________________________________________   LABS (all labs ordered are listed, but only abnormal results are displayed)  Labs Reviewed  WET PREP, GENITAL - Abnormal; Notable for the following components:      Result Value   Trich, Wet Prep PRESENT (*)    WBC, Wet Prep HPF POC RARE (*)    All other components within normal limits  CBC WITH DIFFERENTIAL/PLATELET - Abnormal; Notable for the following components:   WBC 3.7 (*)    Hemoglobin 11.2 (*)    HCT 34.7 (*)    MCV 72.7 (*)    MCH 23.5 (*)    Lymphs Abs 0.6 (*)    All other components within normal limits  BASIC METABOLIC PANEL - Abnormal; Notable for the following components:   Sodium 134 (*)    Potassium 3.3 (*)    CO2 19 (*)     All other components within normal limits  URINALYSIS, ROUTINE W REFLEX MICROSCOPIC - Abnormal; Notable for the following components:   APPearance CLOUDY (*)    Hgb urine dipstick LARGE (*)    Bilirubin Urine SMALL (*)    Ketones, ur >=80 (*)    Nitrite POSITIVE (*)    Leukocytes,Ua SMALL (*)    All other components within normal limits  HCG, QUANTITATIVE, PREGNANCY - Abnormal; Notable for the following components:   hCG, Beta Chain, Quant, S 140,876 (*)    All other components within normal limits  URINALYSIS, MICROSCOPIC (REFLEX) - Abnormal; Notable for the following components:   Bacteria, UA MANY (*)    All other components within normal limits  GC/CHLAMYDIA PROBE AMP ([place]) NOT AT [facility]   ____________________________________________  RADIOLOGY  Sending to [facility] for formal OB US.  ____________________________________________   PROCEDURES  Procedure(s) performed:   Procedures  None  ____________________________________________   INITIAL IMPRESSION / ASSESSMENT AND PLAN / ED COURSE  Pertinent labs & imaging results that were available during my care of the patient were reviewed by me and considered in my medical decision making (see chart for details).   Patient presents emergency department with vaginal bleeding and cramping in the setting of being [redacted] weeks pregnant by dates. Her beta hCG here is greater than 100,000 but on my unofficial bedside ultrasound I am unable to appreciate a intrauterine pregnancy. No abdominal free fluid on ultrasound. Patient is hemodynamically stable. Cervix is visually closed on pelvic exam. Have sent wet prep and gonorrhea/chlamydia but will discuss with OB/GYN regarding transfer for pelvic ultrasound to rule out ectopic pregnancy. Suspicion for this overall is lower. Patient does have urinary tract infection and after ultrasound would require antibiotics.   Spoke with Dr. Macon Large with OB/GYN regarding the case. She will accept the  patient to the MAU. I have updated the nurse midwife currently covering the MAU who will evaluate the patient there. Patient to go by private vehicle.  ____________________________________________  FINAL CLINICAL IMPRESSION(S) / ED DIAGNOSES  Final diagnoses:  Trichimoniasis  Acute cystitis with hematuria  Normal IUP (intrauterine pregnancy) on prenatal ultrasound, first trimester  Subchorionic hematoma in first trimester, single or unspecified fetus     MEDICATIONS GIVEN DURING THIS VISIT:  Medications  metroNIDAZOLE (FLAGYL) tablet 2,000 mg (2,000 mg Oral Given 09/05/20 4401)    Note:  This document was prepared using Dragon voice recognition software and may include unintentional dictation errors.  Alona Bene, MD, [facility] Emergency Medicine    Lendy Dittrich, Arlyss Repress, MD 09/11/20 [phone]

## 2021-05-24 IMAGING — US US OB < 14 WEEKS - US OB TV
1 series · 16 of 28 positions shown · non-contrast
Comparison: None.

CLINICAL DATA: Vaginal bleeding

EXAM:
OBSTETRIC <14 WK US AND TRANSVAGINAL OB US
TECHNIQUE: Both transabdominal and transvaginal ultrasound examinations were
performed for complete evaluation of the gestation as well as the
maternal uterus, adnexal regions, and pelvic cul-de-sac.
Transvaginal technique was performed to assess early pregnancy.

[Series 1: us ob < 14 weeks - us ob tv · 69 acquisitions, 16 frames shown]
[im 1/69]
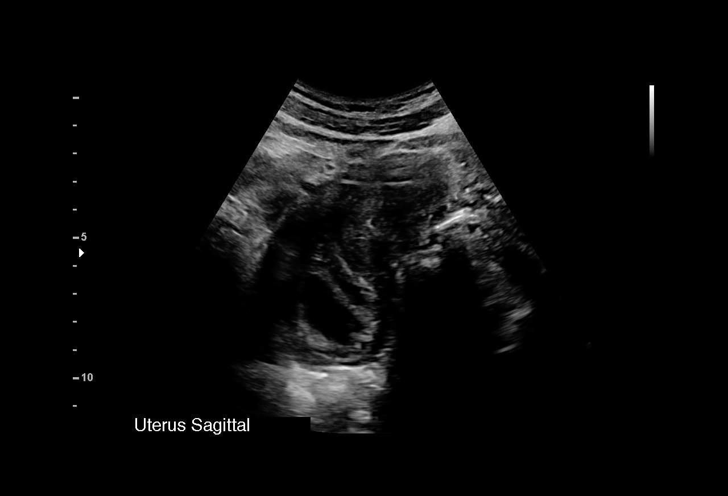
[im 6/69]
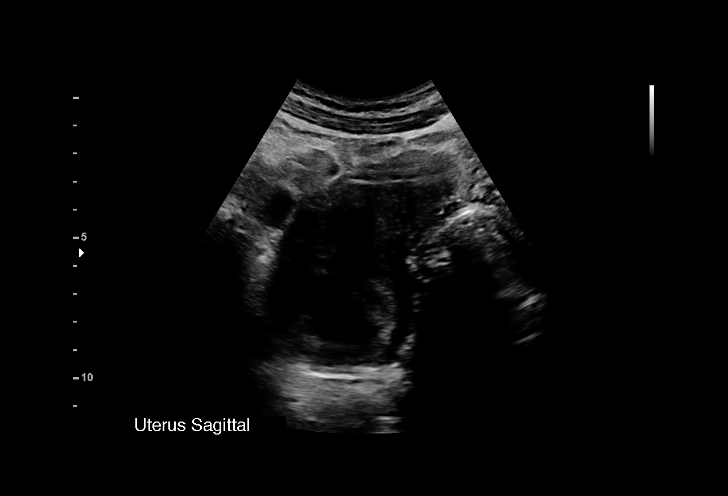
[im 11/69]
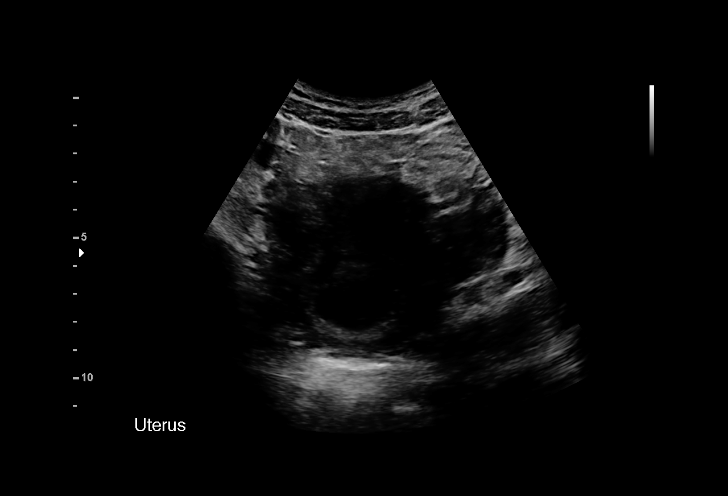
[im 16/69]
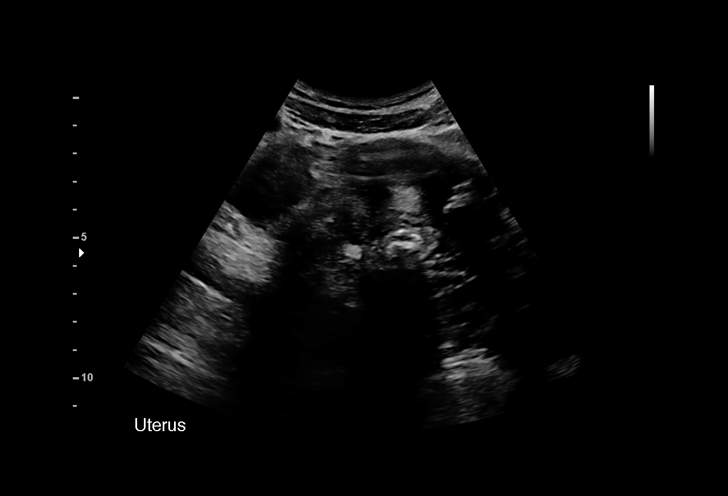
[im 18/69]
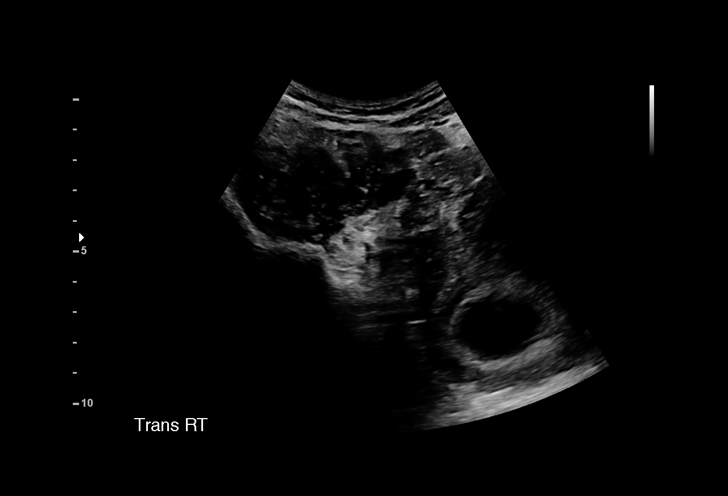
[im 23/69]
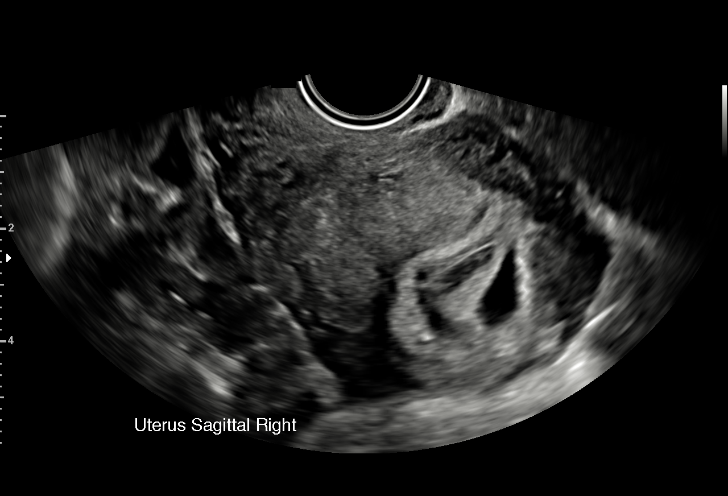
[im 28/69]
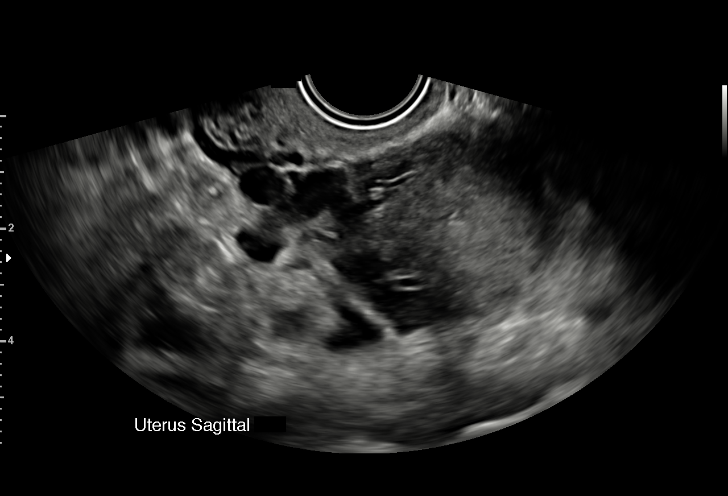
[im 33/69]
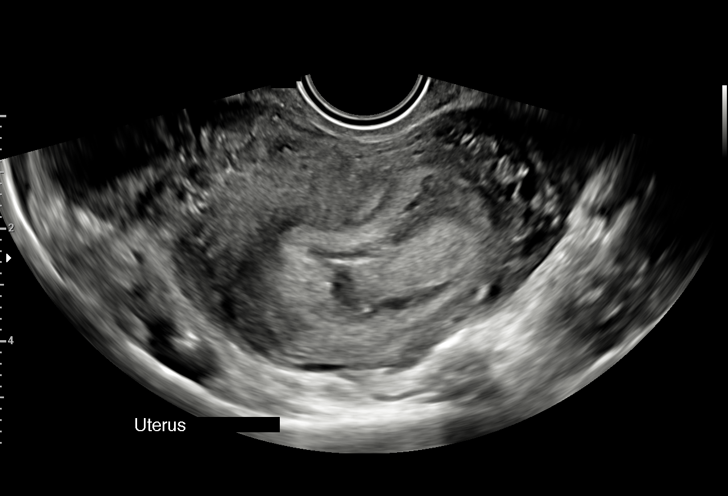
[im 36/69]
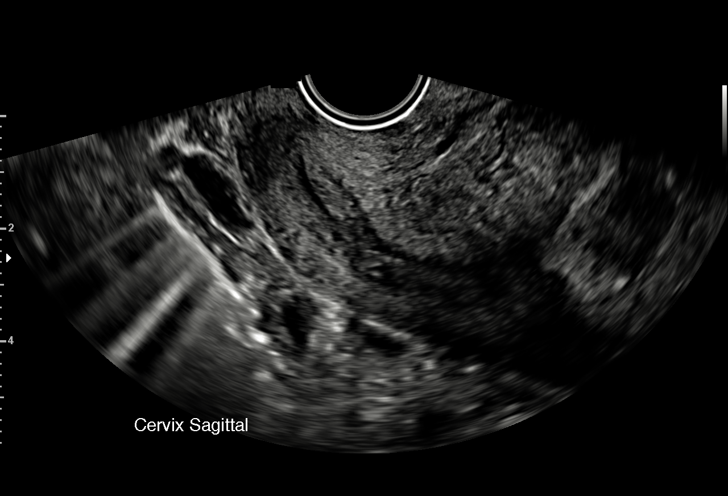
[im 41/69]
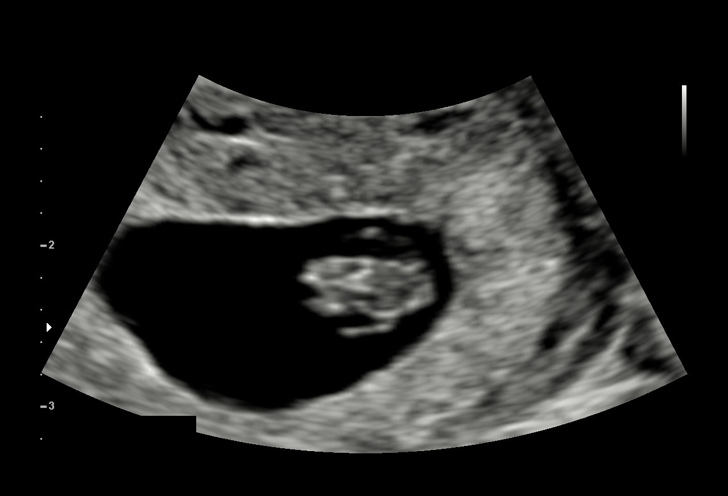
[im 46/69]
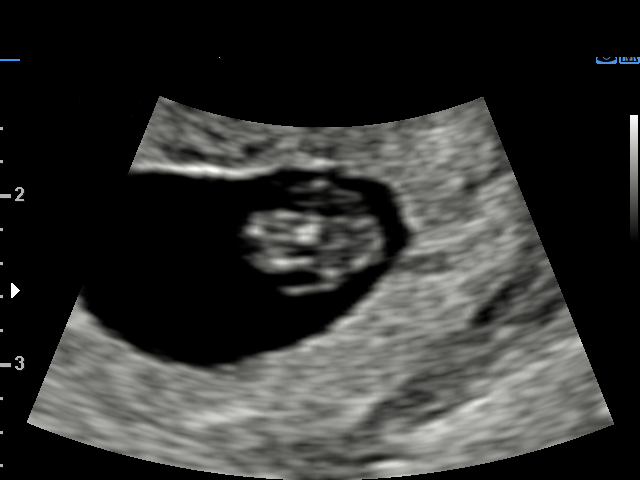
[im 51/69]
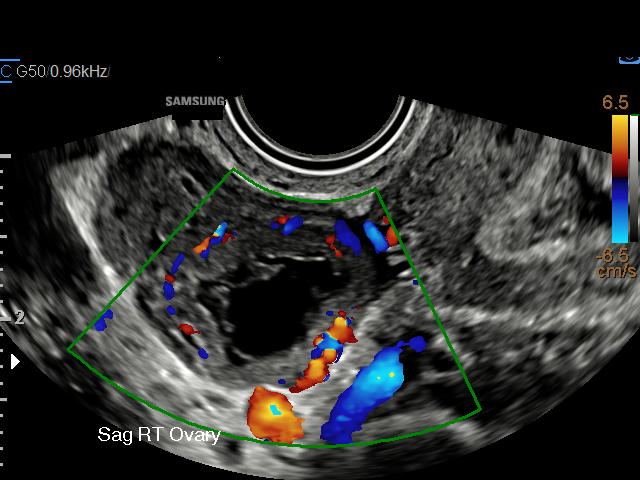
[im 53/69]
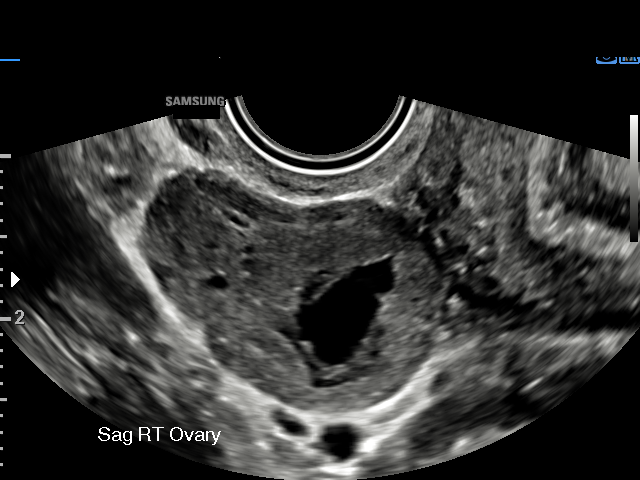
[im 58/69]
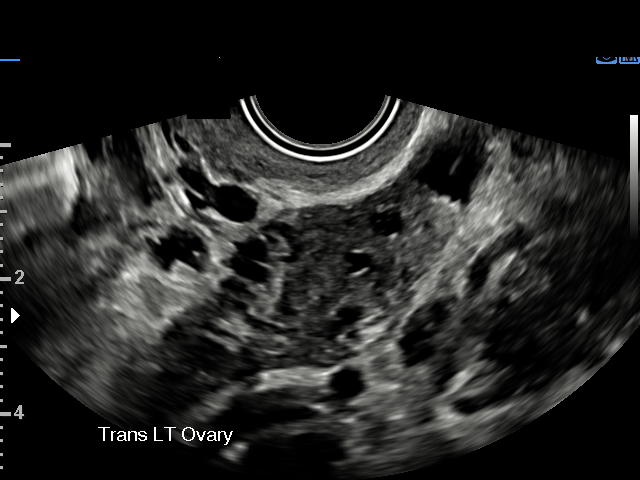
[im 63/69]
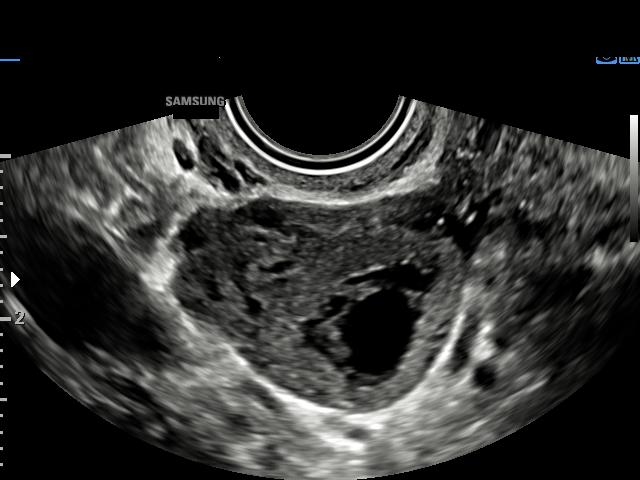
[im 69/69]
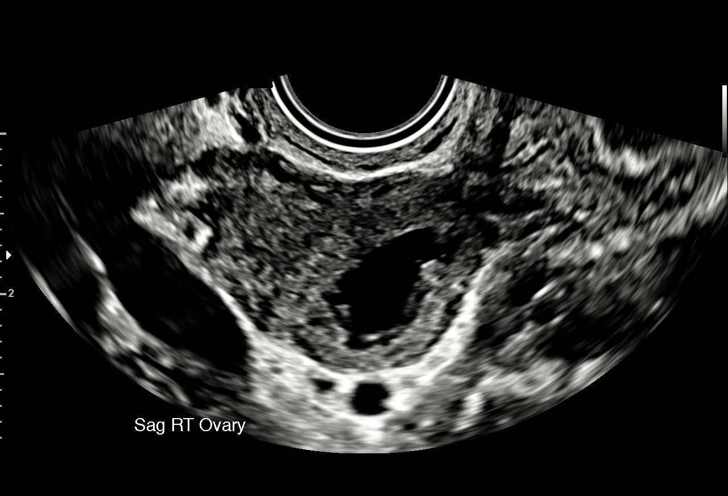

[16 of 28 positions shown; findings below may reference images not displayed]

FINDINGS: Intrauterine gestational sac: Present, single

Yolk sac:  Present, single, normal-appearing

Embryo:  Present, single

Cardiac Activity: Present, regular

Heart Rate: 136 bpm

MSD: Appropriate given fetal size

CRL:  10 mm   7 w   0 d                  US EDC: 04/24/2021

Subchorionic hemorrhage: A large subchorionic hemorrhage is
identified comprising roughly 50% of the chorionic surface.

Maternal uterus/adnexae: The uterus is retroverted. No intrauterine
masses are seen. The cervix is closed and is unremarkable. No free
fluid is seen within the cul-de-sac. The left ovary is unremarkable.
The right ovary contains a a cystic structure most in keeping with a
cystic corpus luteum. No adnexal masses are seen.
IMPRESSION: Single living intrauterine gestation with an estimated gestational
age of 7 weeks, 0 days.

Large subchorionic hemorrhage.

## 2021-11-06 ENCOUNTER — Emergency Department (HOSPITAL_COMMUNITY): Payer: Medicaid Other

## 2021-11-06 ENCOUNTER — Encounter (HOSPITAL_BASED_OUTPATIENT_CLINIC_OR_DEPARTMENT_OTHER): Payer: Self-pay

## 2021-11-06 ENCOUNTER — Emergency Department (HOSPITAL_BASED_OUTPATIENT_CLINIC_OR_DEPARTMENT_OTHER)
Admission: EM | Admit: 2021-11-06 | Discharge: 2021-11-06 | Disposition: A | Discharge status: Home / Self Care | Payer: Medicaid Other | Attending: Emergency Medicine | Admitting: Emergency Medicine

## 2021-11-06 ENCOUNTER — Other Ambulatory Visit: Payer: Self-pay

## 2021-11-06 DIAGNOSIS — R1011 Right upper quadrant pain: Secondary | ICD-10-CM | POA: Diagnosis present

## 2021-11-06 DIAGNOSIS — A599 Trichomoniasis, unspecified: Secondary | ICD-10-CM | POA: Diagnosis not present

## 2021-11-06 LAB — CBC WITH DIFFERENTIAL/PLATELET
Abs Immature Granulocytes: 0.01 10*3/uL (ref 0.00–0.07)
Basophils Absolute: 0.1 10*3/uL (ref 0.0–0.1)
Basophils Relative: 1 %
Eosinophils Absolute: 0.2 10*3/uL (ref 0.0–0.5)
Eosinophils Relative: 4 %
HCT: 35.7 % — ABNORMAL LOW (ref 36.0–46.0)
Hemoglobin: 11.1 g/dL — ABNORMAL LOW (ref 12.0–15.0)
Immature Granulocytes: 0 %
Lymphocytes Relative: 16 %
Lymphs Abs: 0.9 10*3/uL (ref 0.7–4.0)
MCH: 21.9 pg — ABNORMAL LOW (ref 26.0–34.0)
MCHC: 31.1 g/dL (ref 30.0–36.0)
MCV: 70.4 fL — ABNORMAL LOW (ref 80.0–100.0)
Monocytes Absolute: 0.6 10*3/uL (ref 0.1–1.0)
Monocytes Relative: 11 %
Neutro Abs: 3.7 10*3/uL (ref 1.7–7.7)
Neutrophils Relative %: 68 %
Platelets: 448 10*3/uL — ABNORMAL HIGH (ref 150–400)
RBC: 5.07 MIL/uL (ref 3.87–5.11)
RDW: 14.5 % (ref 11.5–15.5)
WBC: 5.4 10*3/uL (ref 4.0–10.5)
nRBC: 0 % (ref 0.0–0.2)

## 2021-11-06 LAB — URINALYSIS, MICROSCOPIC (REFLEX)

## 2021-11-06 LAB — URINALYSIS, ROUTINE W REFLEX MICROSCOPIC
Bilirubin Urine: NEGATIVE
Glucose, UA: NEGATIVE mg/dL
Hgb urine dipstick: NEGATIVE
Ketones, ur: NEGATIVE mg/dL
Nitrite: NEGATIVE
Protein, ur: NEGATIVE mg/dL
Specific Gravity, Urine: 1.015 (ref 1.005–1.030)
pH: 7 (ref 5.0–8.0)

## 2021-11-06 LAB — COMPREHENSIVE METABOLIC PANEL
ALT: 32 U/L (ref 0–44)
AST: 32 U/L (ref 15–41)
Albumin: 3.8 g/dL (ref 3.5–5.0)
Alkaline Phosphatase: 172 U/L — ABNORMAL HIGH (ref 38–126)
Anion gap: 8 (ref 5–15)
BUN: 9 mg/dL (ref 6–20)
CO2: 26 mmol/L (ref 22–32)
Calcium: 9.2 mg/dL (ref 8.9–10.3)
Chloride: 97 mmol/L — ABNORMAL LOW (ref 98–111)
Creatinine, Ser: 1.06 mg/dL — ABNORMAL HIGH (ref 0.44–1.00)
GFR, Estimated: >60 mL/min (ref >60)
Glucose, Bld: 92 mg/dL (ref 70–99)
Potassium: 3.7 mmol/L (ref 3.5–5.1)
Sodium: 131 mmol/L — ABNORMAL LOW (ref 135–145)
Total Bilirubin: 0.6 mg/dL (ref 0.3–1.2)
Total Protein: 7.8 g/dL (ref 6.5–8.1)

## 2021-11-06 LAB — LIPASE, BLOOD: Lipase: 34 U/L (ref 11–51)

## 2021-11-06 LAB — PREGNANCY, URINE: Preg Test, Ur: NEGATIVE

## 2021-11-06 MED ORDER — ONDANSETRON 4 MG PO TBDP
4.0000 mg | ORAL_TABLET | Freq: Once | ORAL | Status: DC
  Filled 2021-11-06: qty 1

## 2021-11-06 MED ORDER — ONDANSETRON HCL 4 MG/2ML IJ SOLN
4.0000 mg | Freq: Once | INTRAMUSCULAR | Status: AC
  Administered 2021-11-06: 4 mg via INTRAVENOUS
  Filled 2021-11-06: qty 2

## 2021-11-06 MED ORDER — FENTANYL CITRATE PF 50 MCG/ML IJ SOSY
50.0000 ug | PREFILLED_SYRINGE | Freq: Once | INTRAMUSCULAR | Status: AC
  Administered 2021-11-06: 50 ug via INTRAVENOUS
  Filled 2021-11-06: qty 1

## 2021-11-06 MED ORDER — DICYCLOMINE HCL 20 MG PO TABS: 20.0000 mg | ORAL_TABLET | Freq: Two times a day (BID) | ORAL | 0 refills | Status: AC

## 2021-11-06 MED ORDER — ONDANSETRON 4 MG PO TBDP: 4.0000 mg | ORAL_TABLET | Freq: Three times a day (TID) | ORAL | 0 refills | Status: AC | PRN

## 2021-11-06 MED ORDER — LACTATED RINGERS IV BOLUS
1000.0000 mL | Freq: Once | INTRAVENOUS | Status: AC
Start: 2021-11-06 — End: 2021-11-06
  Administered 2021-11-06: 1000 mL via INTRAVENOUS

## 2021-11-06 MED ORDER — METRONIDAZOLE 500 MG PO TABS: 500.0000 mg | ORAL_TABLET | Freq: Two times a day (BID) | ORAL | 0 refills | Status: AC

## 2021-11-06 NOTE — ED Provider Notes (Signed)
Patient transferred from [facility] for ultrasound of right upper quadrant to rule out cholecystitis. ? ?Patient's pain is well controlled.  She has been having the symptoms for the past week, but they gradually worsened until tonight and brought her to the emergency department.  Not having any pain now. ? ?Laboratory work-up is fairly reassuring, AST and ALT are normal, alk phos is elevated, but T. bili is normal. ? ?No significant leukocytosis.  Patient is afebrile. ? ?Urinalysis is notable for trichomonas, patient tells me that Dr. Ralene Bathe called in a prescription to treat this.  Based on the patient's description of the symptoms, I do not think that she has a kidney stone.  I do not think she needs a CT scan for any additional work-up. ? ?Ultrasound result is pending at signout. ?  ?Alexandra Sparks, [place], PA-C ?11/06/21 0938 ? ?  ?Orpah Greek, MD ?11/07/21 0402 ? ?

## 2021-11-06 NOTE — ED Triage Notes (Signed)
Pt reports RUQ abd pain that started a week ago, worsens after eating associated with nausea and vomiting. Tonight the pain just didn't go away. Denies fever/diarrhea. ?

## 2021-11-06 NOTE — ED Notes (Signed)
Pt provided water and crackers for PO challenge  

## 2021-11-06 NOTE — ED Provider Notes (Signed)
[place] reviewed ?IMPRESSION:  ?Normal right upper quadrant ultrasound.  ? ? ?Pt without pain.  ? ?Abd soft NTTP. No guarding or rebound. Pt PO challenged successfully.  ? ? ?DC home with flagyl for trich and zofran for nausea ?  ?Gailen Shelter, [place] ?11/06/21 2831 ? ?  ?Bethann Berkshire, MD ?11/07/21 [phone] ? ?

## 2021-11-06 NOTE — Discharge Instructions (Addendum)
Your gall bladder/liver ultrasound was reassuringly without evidence of infection/inflammation.  Please drink plenty of water, take Tylenol 1000 mg every 6 hours as needed for pain.  I have also prescribed you Bentyl which is a medicine that will help with abdominal pain.  He can see if this helps with your symptoms.  Use Zofran as needed for nausea. ?

## 2021-11-06 NOTE — ED Notes (Signed)
US at bedside

## 2021-11-06 NOTE — ED Provider Notes (Signed)
?[facility] EMERGENCY DEPARTMENT ?Provider Note ? ? ?CSN: [id number] ?Arrival date & time: 11/06/21  0148 ? ?  ? ?History ? ?Chief Complaint  ?Patient presents with  ? Abdominal Pain  ? ? ?Alexandra Sparks is a 36 y.o. female. ? ?The history is provided by the patient and medical records.  ?Abdominal Pain ?Alexandra Sparks is a 36 y.o. female who presents to the Emergency Department complaining of abdominal pain.  She complains of sharp RUQ abdominal pain for the last two weeks.  Pain radiates to the right back and is worse with meals.  She has experienced associated N/V for the last three days.  6 episodes of emesis today.  No diarrhea, dysuria.  No prior abdominal surgeries.  She is 6 months post partum, bottle feeding.  Has not had intercourse since delivery.  No dysuria, vaginal discharge.  Has no known medical problems.  Takes no medications.  No prior similar sxs.   ? ?No fever, cough, sob.   ?  ? ?Home Medications ?Prior to Admission medications   ?Medication Sig Start Date End Date Taking? Authorizing Provider  ?metroNIDAZOLE (FLAGYL) 500 MG tablet Take 1 tablet (500 mg total) by mouth 2 (two) times daily. 11/06/21  Yes Tilden Fossa, MD  ?cephALEXin (KEFLEX) 500 MG capsule Take 1 capsule (500 mg total) by mouth 2 (two) times daily. 07/13/13   Horton, Mayer Masker, MD  ?cephALEXin (KEFLEX) 500 MG capsule Take 1 capsule (500 mg total) by mouth 4 (four) times daily. 12/04/17   Palumbo, April, MD  ?cetirizine-pseudoephedrine (ZYRTEC-D) 5-120 MG tablet Take 1 tablet by mouth 2 (two) times daily. 10/27/17   Emi Holes, PA-C  ?diphenhydrAMINE (BENADRYL) 25 MG tablet Take 1 tablet (25 mg total) by mouth every 6 (six) hours as needed for allergies. 01/28/18   Street, [place], PA-C  ?metoCLOPramide (REGLAN) 10 MG tablet Take 1 tablet (10 mg total) by mouth every 6 (six) hours. 06/30/13   Linwood Dibbles, MD  ?mupirocin cream (BACTROBAN) 2 % Apply 1 application topically 2 (two) times daily. 12/04/17   Palumbo, April, MD   ?ondansetron (ZOFRAN ODT) 4 MG disintegrating tablet Take 1 tablet (4 mg total) by mouth every 8 (eight) hours as needed. 04/26/19   Horton, Mayer Masker, MD  ?Prenatal Vit-Fe Fumarate-FA (PRENATAL MULTIVITAMIN) TABS tablet Take 1 tablet by mouth daily at 12 noon. "A prenatal vitamin", not sure what/which    [provider]  ?ranitidine (ZANTAC) 150 MG tablet Take 1 tablet (150 mg total) by mouth 2 (two) times daily for 5 days. 01/28/18 02/02/18  Street, [place], PA-C  ?   ? ?Allergies    ?Nickel   ? ?Review of Systems   ?Review of Systems  ?Gastrointestinal:  Positive for abdominal pain.  ?All other systems reviewed and are negative. ? ?Physical Exam ?Updated Vital Signs ?BP 124/72 (BP Location: Right Arm)   Pulse 91   Temp 98.5 ?F (36.9 ?C) (Oral)   Resp 18   Ht 5\' 2"  (1.575 m)   Wt 66.2 kg   LMP 10/20/2021   SpO2 96%   Breastfeeding No   BMI 26.70 kg/m?  ?Physical Exam ?Vitals and nursing note reviewed.  ?Constitutional:   ?   Appearance: She is well-developed.  ?HENT:  ?   Head: Normocephalic and atraumatic.  ?Cardiovascular:  ?   Rate and Rhythm: Normal rate and regular rhythm.  ?Pulmonary:  ?   Effort: Pulmonary effort is normal. No respiratory distress.  ?Abdominal:  ?  Palpations: Abdomen is soft.  ?   Tenderness: There is no guarding or rebound.  ?   Comments: Mild to moderate RUQ tenderness, + murphy  ?Musculoskeletal:     ?   General: No tenderness.  ?Skin: ?   General: Skin is warm and dry.  ?Neurological:  ?   Mental Status: She is alert and oriented to person, place, and time.  ?Psychiatric:     ?   Behavior: Behavior normal.  ? ? ?ED Results / Procedures / Treatments   ?Labs ?(all labs ordered are listed, but only abnormal results are displayed) ?Labs Reviewed  ?COMPREHENSIVE METABOLIC PANEL - Abnormal; Notable for the following components:  ?    Result Value  ? Sodium 131 (*)   ? Chloride 97 (*)   ? Creatinine, Ser 1.06 (*)   ? Alkaline Phosphatase 172 (*)   ? All other components  within normal limits  ?URINALYSIS, ROUTINE W REFLEX MICROSCOPIC - Abnormal; Notable for the following components:  ? APPearance HAZY (*)   ? Leukocytes,Ua MODERATE (*)   ? All other components within normal limits  ?CBC WITH DIFFERENTIAL/PLATELET - Abnormal; Notable for the following components:  ? Hemoglobin 11.1 (*)   ? HCT 35.7 (*)   ? MCV 70.4 (*)   ? MCH 21.9 (*)   ? Platelets 448 (*)   ? All other components within normal limits  ?URINALYSIS, MICROSCOPIC (REFLEX) - Abnormal; Notable for the following components:  ? Bacteria, UA RARE (*)   ? Trichomonas, UA PRESENT (*)   ? All other components within normal limits  ?LIPASE, BLOOD  ?PREGNANCY, URINE  ?GC/CHLAMYDIA PROBE AMP ([place]) NOT AT [facility]  ? ? ?EKG ?EKG Interpretation ? ?Date/Time:  Wednesday November 06 2021 02:03:53 EST ?Ventricular Rate:  82 ?PR Interval:  140 ?QRS Duration: 92 ?QT Interval:  357 ?QTC Calculation: 417 ?R Axis:   10 ?Text Interpretation: Sinus rhythm Confirmed by Tilden Fossa [phone]) on 11/06/2021 2:33:11 AM ? ?Radiology ?No results found. ? ?Procedures ?Procedures  ? ? ?Medications Ordered in ED ?Medications  ?fentaNYL (SUBLIMAZE) injection 50 mcg (50 mcg Intravenous Given 11/06/21 0226)  ?ondansetron [facility]) injection 4 mg (4 mg Intravenous Given 11/06/21 0226)  ?lactated ringers bolus 1,000 mL (1,000 mLs Intravenous New Bag/Given 11/06/21 0258)  ? ? ?ED Course/ Medical Decision Making/ A&P ?  ?                        ?Medical Decision Making ?Amount and/or Complexity of Data Reviewed ?Labs: ordered. ?Radiology: ordered. ? ?Risk ?Prescription drug management. ? ? ?Patient is 6 months postpartum here for evaluation of 2 weeks of progressive right upper quadrant pain with several days of vomiting.  She does have focal right upper quadrant tenderness on examination without peritoneal findings.  BMP consistent with dehydration with elevation in her creatinine, low sodium.  She is not pregnant today.  UA does demonstrate trichomonas.  Patient  appears asymptomatic.  She does have a history of trichomonas infection in the past.  Discussed with patient findings of studies, recommend starting treatment.  Feel her right upper quadrant abdominal pain is likely not secondary to trichomonas infection.  Recommend right upper quadrant ultrasound to rule out cholecystitis.  Ultrasound is not currently available at this facility.  Plan to transfer after she completes her IV fluids for right upper quadrant ultrasound for further work-up.  Discussed with Dr. Blinda Leatherwood at the [facility] emergency department who accepts the patient  in transfer. ? ? ? ? ? ? ? ?Final Clinical Impression(s) / ED Diagnoses ?Final diagnoses:  ?RUQ abdominal pain  ?Trichomonas infection  ? ? ?Rx / DC Orders ?ED Discharge Orders   ? ?      Ordered  ?  metroNIDAZOLE (FLAGYL) 500 MG tablet  2 times daily       ? 11/06/21 0304  ? ?  ?  ? ?  ? ? ?  ?Tilden Fossa, MD ?11/06/21 [phone] ? ?

## 2021-11-06 NOTE — ED Notes (Signed)
Pt to [facility] ED POV for U/S as U/S is not currently available at [facility].  Pt affirms she is aware of need to not drink or eat anything and to go straight over to [facility] ED.  Pt accepted by Dr. Blinda Leatherwood. Paperwork sent with pt.  Pt ambulatory to vehicle (mother is driving her). Pt A&Ox4, NAD noted, gait even and steady. ?

## 2021-11-07 LAB — GC/CHLAMYDIA PROBE AMP (~~LOC~~) NOT AT ARMC
Chlamydia: NEGATIVE
Comment: NEGATIVE
Comment: NORMAL
Neisseria Gonorrhea: NEGATIVE

## 2022-07-25 IMAGING — US US ABDOMEN LIMITED
1 series · 15 of 25 positions shown · non-contrast
Comparison: CT Abdomen and Pelvis 07/28/2015.

CLINICAL DATA: 33-year-old female with right upper quadrant pain
for 1 week.

EXAM:
ULTRASOUND ABDOMEN LIMITED RIGHT UPPER QUADRANT

[Series 1: us abdomen limited ruq mc & wl · 15 of 47 slices shown]
[im 1/47]
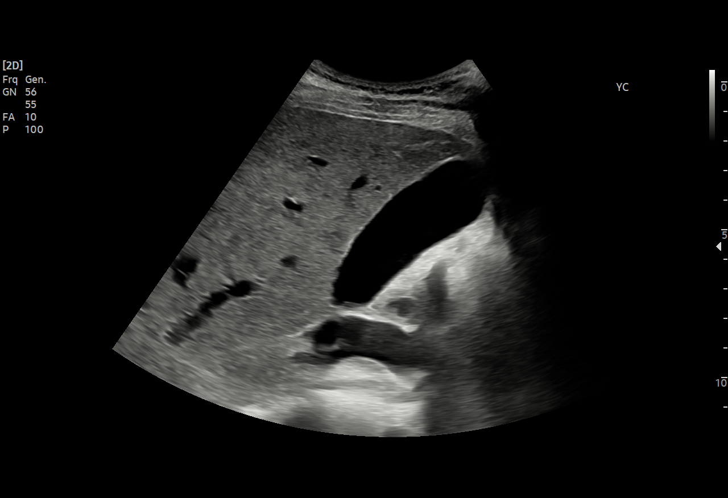
[im 4/47]
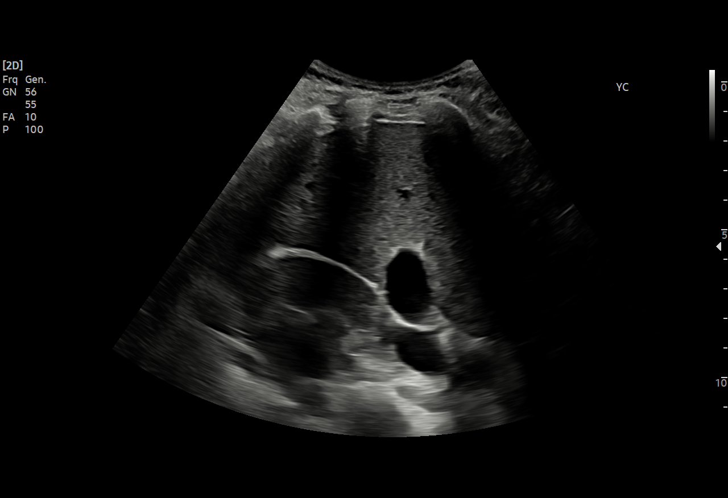
[im 8/47]
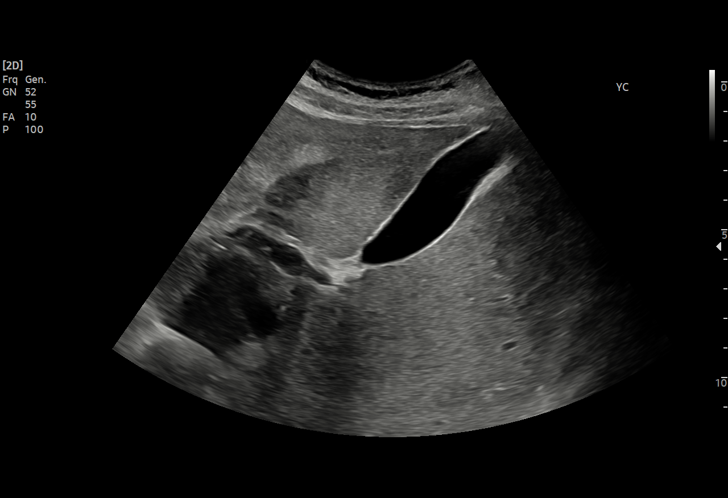
[im 10/47]
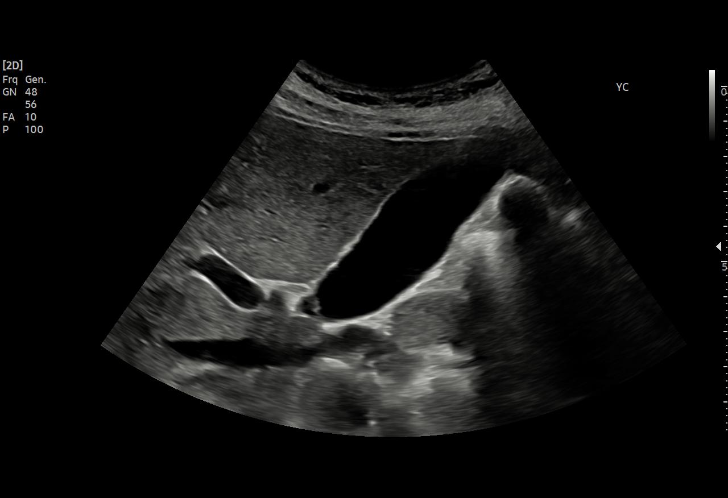
[im 14/47]
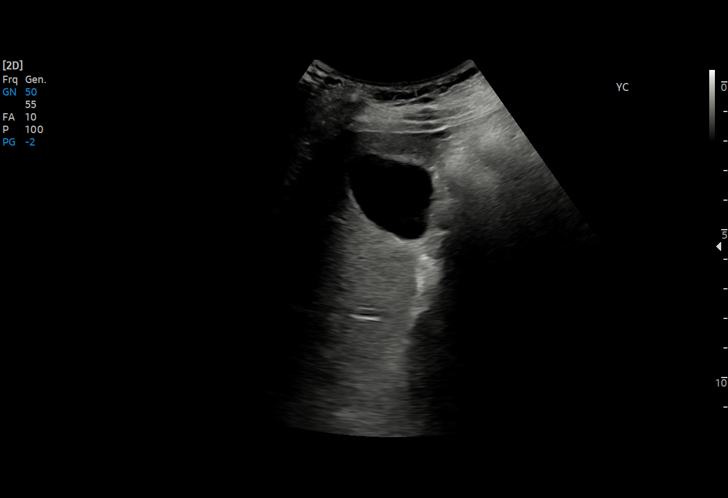
[im 18/47]
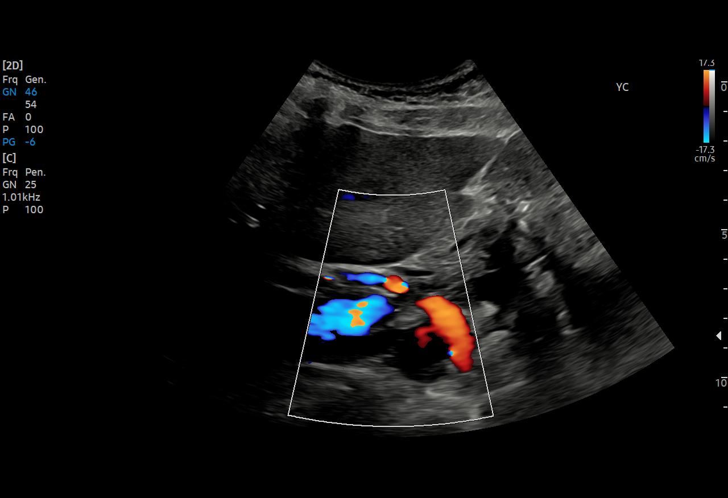
[im 20/47]
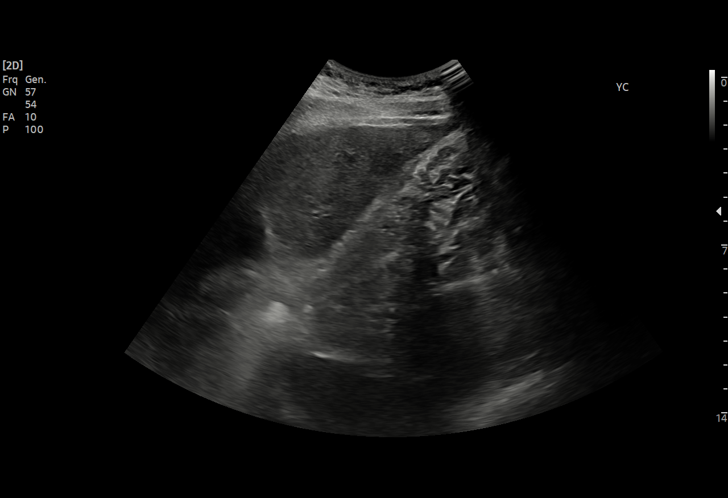
[im 24/47]
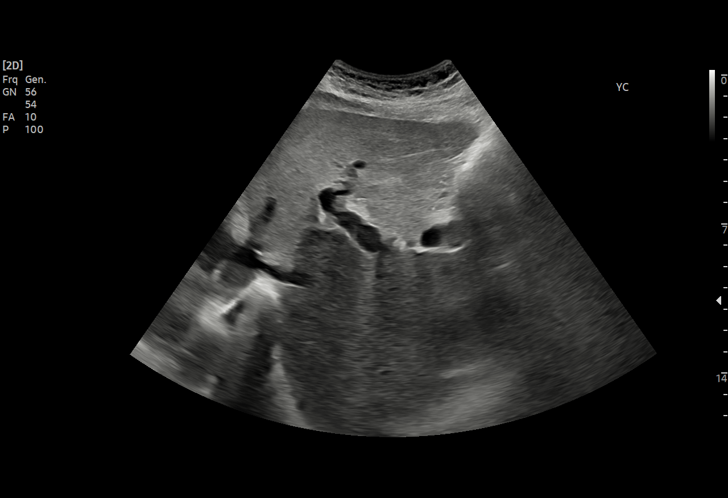
[im 27/47]
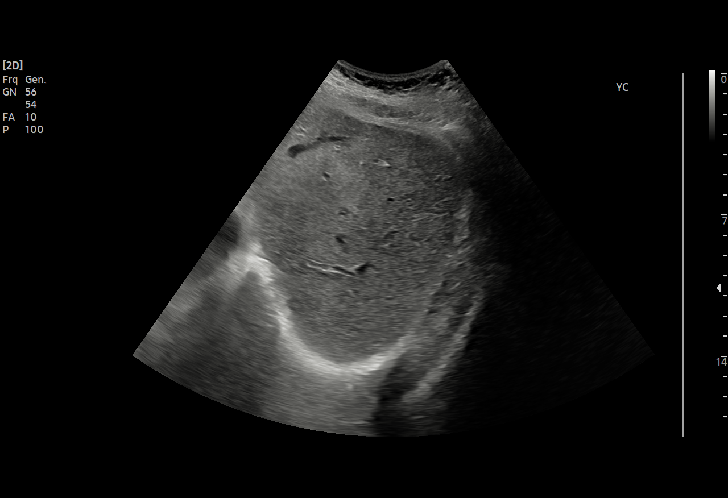
[im 29/47]
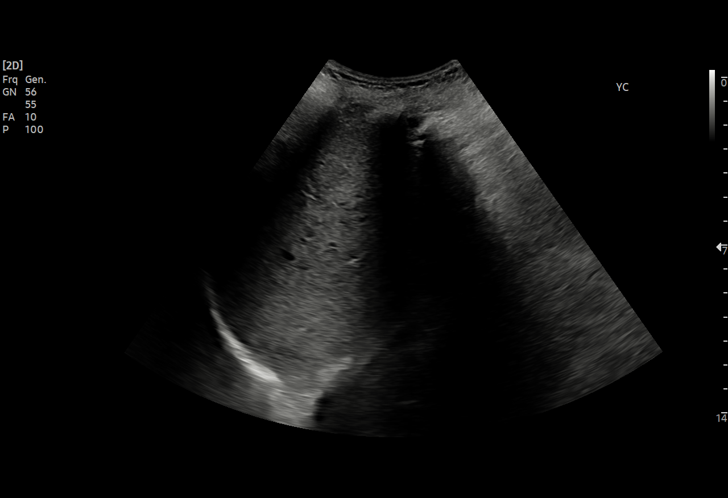
[im 33/47]
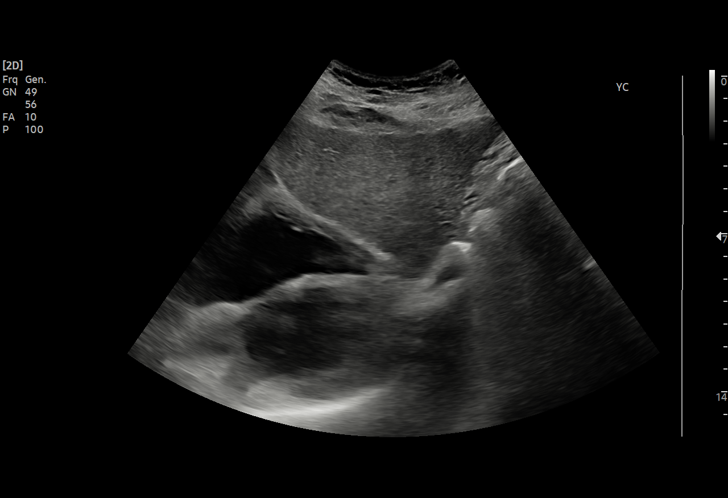
[im 37/47]
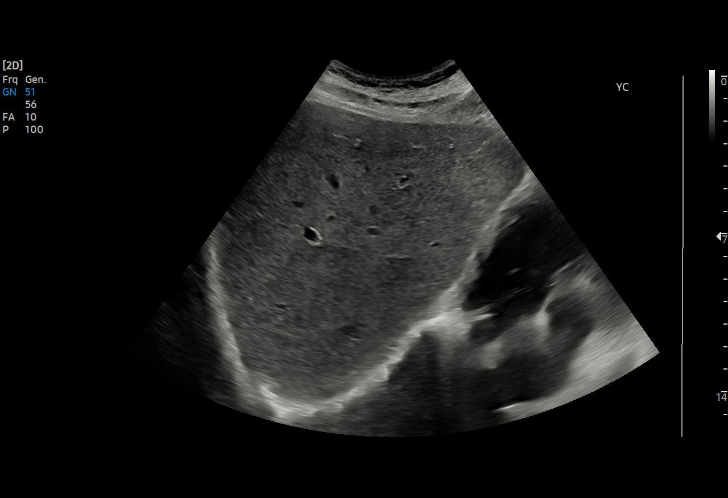
[im 39/47]
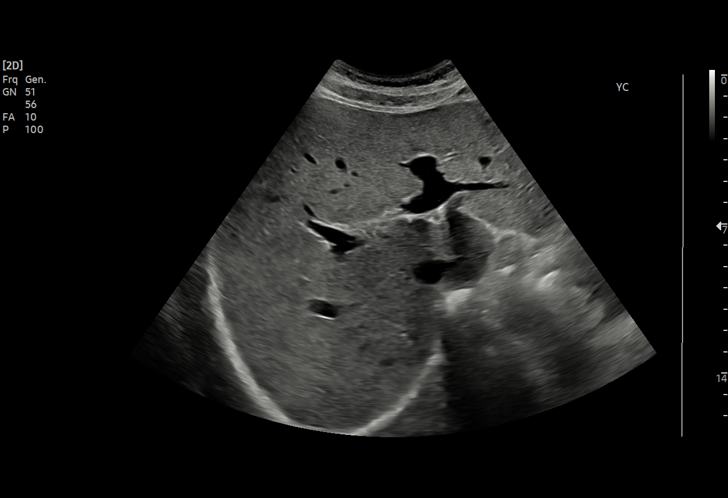
[im 43/47]
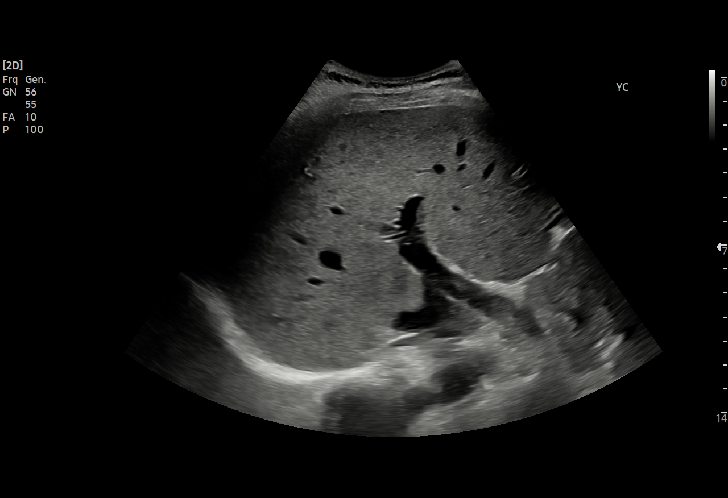
[im 47/47]
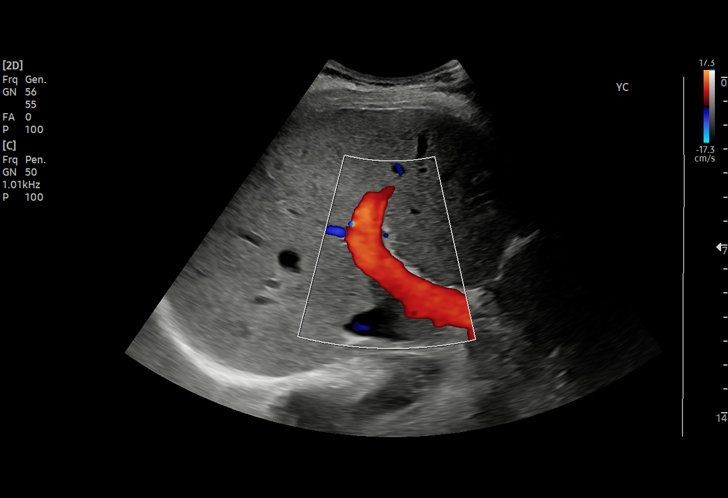

[15 of 25 positions shown; findings below may reference images not displayed]

FINDINGS: Gallbladder:

No gallstones or wall thickening visualized. No sonographic Murphy
sign noted by sonographer.

Common bile duct:

Diameter: 2-3 mm, normal.

Liver:

No focal lesion identified. Within normal limits in parenchymal
echogenicity. Portal vein is patent on color Doppler imaging with
normal direction of blood flow towards the liver.

Other: Negative visible right kidney. No right upper quadrant free
fluid.
IMPRESSION: Normal right upper quadrant ultrasound.
